# Patient Record
Sex: Female | Born: 1960 | Race: White | Hispanic: No | Marital: Married | Smoking: Never smoker
Health system: Southern US, Community
[De-identification: ages and names within clinical notes are randomized; demographics above are authoritative.]

## PROBLEM LIST (undated history)

## (undated) DIAGNOSIS — H8109 Meniere's disease, unspecified ear: Secondary | ICD-10-CM

## (undated) DIAGNOSIS — I639 Cerebral infarction, unspecified: Secondary | ICD-10-CM

## (undated) DIAGNOSIS — K5190 Ulcerative colitis, unspecified, without complications: Secondary | ICD-10-CM

## (undated) HISTORY — DX: Cerebral infarction, unspecified: I63.9

## (undated) HISTORY — DX: Ulcerative colitis, unspecified, without complications: K51.90

## (undated) HISTORY — DX: Meniere's disease, unspecified ear: H81.09

---

## 1997-12-16 ENCOUNTER — Other Ambulatory Visit: Admission: RE | Admit: 1997-12-16 | Discharge: 1997-12-16 | Payer: Self-pay | Admitting: Obstetrics & Gynecology

## 2001-06-22 ENCOUNTER — Ambulatory Visit (HOSPITAL_COMMUNITY): Admission: RE | Admit: 2001-06-22 | Discharge: 2001-06-22 | Payer: Self-pay | Admitting: Obstetrics and Gynecology

## 2001-06-22 ENCOUNTER — Encounter (INDEPENDENT_AMBULATORY_CARE_PROVIDER_SITE_OTHER): Payer: Self-pay

## 2002-11-19 ENCOUNTER — Other Ambulatory Visit: Admission: RE | Admit: 2002-11-19 | Discharge: 2002-11-19 | Payer: Self-pay | Admitting: Obstetrics and Gynecology

## 2004-02-20 ENCOUNTER — Other Ambulatory Visit: Admission: RE | Admit: 2004-02-20 | Discharge: 2004-02-20 | Payer: Self-pay | Admitting: Obstetrics and Gynecology

## 2005-05-19 ENCOUNTER — Other Ambulatory Visit: Admission: RE | Admit: 2005-05-19 | Discharge: 2005-05-19 | Payer: Self-pay | Admitting: Obstetrics and Gynecology

## 2005-05-26 ENCOUNTER — Encounter: Admission: RE | Admit: 2005-05-26 | Discharge: 2005-05-26 | Payer: Self-pay | Admitting: Obstetrics and Gynecology

## 2005-06-21 ENCOUNTER — Ambulatory Visit: Payer: Self-pay | Admitting: Internal Medicine

## 2006-05-22 ENCOUNTER — Encounter: Admission: RE | Admit: 2006-05-22 | Discharge: 2006-05-22 | Payer: Self-pay | Admitting: Obstetrics and Gynecology

## 2006-09-06 ENCOUNTER — Ambulatory Visit (HOSPITAL_COMMUNITY): Admission: RE | Admit: 2006-09-06 | Discharge: 2006-09-06 | Payer: Self-pay | Admitting: Gastroenterology

## 2006-09-06 ENCOUNTER — Encounter (INDEPENDENT_AMBULATORY_CARE_PROVIDER_SITE_OTHER): Payer: Self-pay | Admitting: Specialist

## 2006-10-23 ENCOUNTER — Emergency Department (HOSPITAL_COMMUNITY): Admission: EM | Admit: 2006-10-23 | Discharge: 2006-10-24 | Payer: Self-pay | Admitting: Emergency Medicine

## 2007-06-04 ENCOUNTER — Encounter: Admission: RE | Admit: 2007-06-04 | Discharge: 2007-06-04 | Payer: Self-pay | Admitting: Obstetrics and Gynecology

## 2008-06-06 HISTORY — PX: LUMBAR DISC SURGERY: SHX700

## 2008-06-25 ENCOUNTER — Encounter: Admission: RE | Admit: 2008-06-25 | Discharge: 2008-06-25 | Payer: Self-pay | Admitting: Obstetrics and Gynecology

## 2008-09-07 ENCOUNTER — Emergency Department (HOSPITAL_COMMUNITY): Admission: EM | Admit: 2008-09-07 | Discharge: 2008-09-07 | Payer: Self-pay | Admitting: Emergency Medicine

## 2008-09-15 ENCOUNTER — Ambulatory Visit (HOSPITAL_COMMUNITY): Admission: RE | Admit: 2008-09-15 | Discharge: 2008-09-16 | Payer: Self-pay | Admitting: Neurological Surgery

## 2009-07-02 ENCOUNTER — Encounter: Admission: RE | Admit: 2009-07-02 | Discharge: 2009-07-02 | Payer: Self-pay | Admitting: Obstetrics and Gynecology

## 2010-06-26 ENCOUNTER — Encounter: Payer: Self-pay | Admitting: Obstetrics and Gynecology

## 2010-07-05 ENCOUNTER — Encounter
Admission: RE | Admit: 2010-07-05 | Discharge: 2010-07-05 | Payer: Self-pay | Source: Home / Self Care | Attending: Obstetrics and Gynecology | Admitting: Obstetrics and Gynecology

## 2010-07-07 ENCOUNTER — Other Ambulatory Visit: Payer: Self-pay | Admitting: Obstetrics and Gynecology

## 2010-07-07 DIAGNOSIS — R928 Other abnormal and inconclusive findings on diagnostic imaging of breast: Secondary | ICD-10-CM

## 2010-07-13 ENCOUNTER — Ambulatory Visit
Admission: RE | Admit: 2010-07-13 | Discharge: 2010-07-13 | Disposition: A | Discharge status: Home / Self Care | Payer: BC Managed Care – PPO | Source: Ambulatory Visit | Attending: Obstetrics and Gynecology | Admitting: Obstetrics and Gynecology

## 2010-07-13 DIAGNOSIS — R928 Other abnormal and inconclusive findings on diagnostic imaging of breast: Secondary | ICD-10-CM

## 2010-08-21 ENCOUNTER — Emergency Department (HOSPITAL_COMMUNITY)
Admission: EM | Admit: 2010-08-21 | Discharge: 2010-08-22 | Disposition: A | Discharge status: Home / Self Care | Payer: BC Managed Care – PPO | Attending: Emergency Medicine | Admitting: Emergency Medicine

## 2010-08-21 ENCOUNTER — Emergency Department (HOSPITAL_COMMUNITY): Payer: BC Managed Care – PPO

## 2010-08-21 DIAGNOSIS — R112 Nausea with vomiting, unspecified: Secondary | ICD-10-CM | POA: Insufficient documentation

## 2010-08-21 DIAGNOSIS — H81399 Other peripheral vertigo, unspecified ear: Secondary | ICD-10-CM | POA: Insufficient documentation

## 2010-08-21 DIAGNOSIS — G319 Degenerative disease of nervous system, unspecified: Secondary | ICD-10-CM | POA: Insufficient documentation

## 2010-08-21 DIAGNOSIS — R51 Headache: Secondary | ICD-10-CM | POA: Insufficient documentation

## 2010-08-25 ENCOUNTER — Other Ambulatory Visit: Payer: Self-pay | Admitting: Internal Medicine

## 2010-08-25 ENCOUNTER — Inpatient Hospital Stay: Admission: RE | Admit: 2010-08-25 | Payer: BC Managed Care – PPO | Source: Ambulatory Visit

## 2010-08-25 DIAGNOSIS — R42 Dizziness and giddiness: Secondary | ICD-10-CM

## 2010-08-25 DIAGNOSIS — R51 Headache: Secondary | ICD-10-CM

## 2010-08-25 DIAGNOSIS — R270 Ataxia, unspecified: Secondary | ICD-10-CM

## 2010-09-02 ENCOUNTER — Other Ambulatory Visit: Payer: Self-pay | Admitting: Otolaryngology

## 2010-09-02 DIAGNOSIS — R42 Dizziness and giddiness: Secondary | ICD-10-CM

## 2010-09-02 DIAGNOSIS — R270 Ataxia, unspecified: Secondary | ICD-10-CM

## 2010-09-02 DIAGNOSIS — R51 Headache: Secondary | ICD-10-CM

## 2010-09-03 ENCOUNTER — Ambulatory Visit
Admission: RE | Admit: 2010-09-03 | Discharge: 2010-09-03 | Disposition: A | Discharge status: Home / Self Care | Payer: BC Managed Care – PPO | Source: Ambulatory Visit | Attending: Otolaryngology | Admitting: Otolaryngology

## 2010-09-03 DIAGNOSIS — R270 Ataxia, unspecified: Secondary | ICD-10-CM

## 2010-09-03 DIAGNOSIS — R51 Headache: Secondary | ICD-10-CM

## 2010-09-03 DIAGNOSIS — R42 Dizziness and giddiness: Secondary | ICD-10-CM

## 2010-09-03 MED ORDER — GADOBENATE DIMEGLUMINE 529 MG/ML IV SOLN
13.0000 mL | Freq: Once | INTRAVENOUS | Status: AC | PRN
  Administered 2010-09-03: 13 mL via INTRAVENOUS

## 2010-09-15 LAB — CBC
Hemoglobin: 12.6 g/dL (ref 12.0–15.0)
RBC: 3.73 MIL/uL — ABNORMAL LOW (ref 3.87–5.11)
RDW: 11.7 % (ref 11.5–15.5)
WBC: 5.9 10*3/uL (ref 4.0–10.5)

## 2010-10-19 NOTE — Op Note (Signed)
NAME:  Kerri Gonzalez, Kerri Gonzalez                ACCOUNT NO.:  [id number]   MEDICAL RECORD NO.:  [id number]          PATIENT TYPE:  OIB   LOCATION:  3528                         FACILITY:  [facility]   PHYSICIAN:  Stefani Dama, M.D.  DATE OF BIRTH:  [birth year]   DATE OF PROCEDURE:  09/15/2008  DATE OF DISCHARGE:                               OPERATIVE REPORT   PREOPERATIVE DIAGNOSIS:  Herniated nucleus pulposus, L2-L3 right with  right lumbar radiculopathy.   POSTOPERATIVE DIAGNOSIS:  Herniated nucleus pulposus, L2-L3 right with  right lumbar radiculopathy.   PROCEDURE:  METRx diskectomy, L2-L3 right with operating microscope,  microdissection technique.   SURGEON:  Stefani Dama, MD   FIRST ASSISTANT:  Danae Orleans. Venetia Maxon, MD   ANESTHESIA:  General endotracheal.   INDICATIONS:  Kerri Gonzalez is a 50 year old individual, who had the  acute onset of severe and unrelenting pain about 2 weeks ago.  She was  away on occasion when she bent to lift something and experienced sudden  pain, radiating into the right buttock and right thigh.  She was found  to have a large extruded fragment of disk on the right side at the L2-L3  level causing compression cephalad in the foramen of the L2 nerve root  having failed efforts at conservative management including some high-  dose steroids and bedrest with significant weakness in the iliopsoas.  She has now been advised regarding surgical decompression.   PROCEDURE:  The patient was brought to the operating room supine on the  stretcher.  After smooth induction of general endotracheal anesthesia,  she was turned prone.  The back was prepped with alcohol and DuraPrep  and draped in sterile fashion.  Fluoroscopic guidance was used to  localize the L2-L3 space on the right side, and then the skin above this  area was infiltrated with 10 mL of 1% lidocaine with epinephrine.  A K-  wire was then passed to the laminar arch of L2 and then using a winding  technique,  a series of dilators were passed over the K-wire.  A small  incision was created over the K-wire after it was passed.  The  dilatation was continued to 20-mm mark and then a 6 cm x 18 mm wide  cannula was placed over the dilators and fixed to the operating table  with a clamp.  The dilators were removed, and the operating microscope  was brought into view.  The laminar arch of L2 was identified and with  fluoroscopic localization just above the area of the disk, a small  laminotomy was created within the laminar arch of L2.  The common dural  tube was identified.  The dissection was carried out slightly laterally,  and then a suction retractor was placed along the lateral aspect of the  dura, and this was retracted medially.  With some probing, the fragment  of disk presented itself.  This was immediately below the L2 nerve root,  which could be visualized superiorly through this small aperture.  The  fragment could be taken up with a pituitary rongeur and it was removed  as a singular large piece.  Significant bleeding followed this and after  packing, the area was probed once and twice.  No other fragments of disk  were identified.  The decompression being obtained in this fashion.  The  retractors were removed.  Hemostasis superficially was obtained without  difficulty using some small pledgets of Gelfoam, which  were irrigated away and then the cannula was removed, and the wound was  closed with 3-0 Vicryl in interrupted fashion the subcutaneous and  subcuticular tissues.  Dermabond was placed on the skin.  Blood loss  estimated less than 50 mL.      Stefani Dama, M.D.  Electronically Signed     HJE/MEDQ  D:  09/15/2008  T:  09/16/2008  Job:  [id number]

## 2010-10-22 NOTE — Op Note (Signed)
NAME:  Kerri Gonzalez, Kerri Gonzalez                ACCOUNT NO.:  [id number]   MEDICAL RECORD NO.:  [id number]          PATIENT TYPE:  AMB   LOCATION:  ENDO                         FACILITY:  [facility]   PHYSICIAN:  Anselmo Rod, M.D.  DATE OF BIRTH:  Jun 08, 1960   DATE OF PROCEDURE:  09/06/2006  DATE OF DISCHARGE:                               OPERATIVE REPORT   PROCEDURE PERFORMED:  Colonoscopy with multiple cold biopsies.   ENDOSCOPIST:  Anselmo Rod, M.D.   INSTRUMENT USED:  Pentax video colonoscope.   INDICATIONS FOR PROCEDURE:  A 50 year old white female with a family  history of colon cancer in a paternal grandfather and personal history  of rectal bleeding undergoing a colonoscopy for change in bowel habits  and blood in stool.  Rule out IBD, colonic polyps, masses, etc.   PREPROCEDURE PREPARATION:  Informed consent was procured from the  patient.  The patient fasted for 4 hours prior to the procedure and  prepped with a bottle of MiraLax and Gatorade the night prior to the  procedure. The risks and benefits of the procedure including a 10% miss  rate of cancer and polyps were discussed with the patient as well.   PREPROCEDURE PHYSICAL:  The patient had stable vital signs.  Neck  supple. Chest clear to auscultation. S1, S2, regular.  Abdomen soft with  normal bowel sounds.   DESCRIPTION OF PROCEDURE:  The patient was placed in the left lateral  decubitus position and sedated with 70 mcg of Fentanyl and 7.5 mg of  Versed given intravenously in slow incremental doses.  Once the patient  was adequately sedated and maintained on low-flow oxygen and continuous  cardiac monitoring, the Pentax video colonoscope was advanced from the  rectum to the cecum.  The appendiceal orifice and ileocecal valve were  visualized and photographed.  The terminal ileum appeared healthy and  without lesions. Inflammatory changes with exudates were noted from 0 to  10 cm, sigmoid diverticulosis was noted.   Small internal hemorrhoids  were appreciated on retroflexion of the rectum.  The patient tolerated  the procedure well without complication.  Multiple biopsies were done  from the rectum from 0 to 10 cm.   RECOMMENDATIONS:  1. Await pathology results.  2. Canasa suppositories are going to be started for the patient 1000      mg 1 p.o. q.h.s.  3. Outpatient follow-up in the next 2 weeks for further      recommendations.      Anselmo Rod, M.D.  Electronically Signed     JNM/MEDQ  D:  09/06/2006  T:  09/06/2006  Job:  [id number]   cc:   Marcelino Duster L. Vincente Poli, M.D.

## 2010-10-22 NOTE — Op Note (Signed)
[facility]  Patient:    Kerri Gonzalez, Kerri Gonzalez Visit Number: [id number] MRN: [id number]          Service Type: DSU Location: [facility] Attending Physician:  Trevor Iha Dictated by:   Trevor Iha, M.D. Proc. Date: 06/22/01 Admit Date:  06/22/2001 Discharge Date: 06/22/2001                             Operative Report  PREOPERATIVE DIAGNOSES:       1. Abnormal uterine bleeding.                               2. Endometrial mass on histogram.  POSTOPERATIVE DIAGNOSES:      1. Abnormal uterine bleeding.                               2. Endometrial mass on histogram.  OPERATION:                    Hysteroscopy and dilatation and curettage.  SURGEON:                      Trevor Iha, M.D.  ANESTHESIA:                   Monitored anesthetic care and paracervical block.  INDICATIONS:                  Ms. Goga is a 50 year old, G5, P2, A3, with abnormal bleeding not responsive to conservative medical management including oral contraceptive agents.  She underwent a histogram which shows a small anterior echogenic area which may represent a small fibroid or polyp.  She presents today for definitive surgical evaluation and treatment.  Risks and benefits were discussed at length.  Informed consent was obtained.  FINDINGS:                     Essentially normal-appearing endometrium and normal-appearing ostia.  After curettage, the anterior endometrium showed slight ridge which may represent underlying fibroids.  No definitive polyp or fibroid projected into the endometrium were noted.  DESCRIPTION OF PROCEDURE:     After adequate analgesia, the patient placed in the dorsal lithotomy position.  She was sterilely prepped and draped.  The bladder was sterilely drained.  Graves speculum was placed.  Tenaculum was placed on anterior lip of the cervix.  A paracervical block was placed with 1% Xylocaine with 1:100,000 epinephrine.  The uterus  was sounded to 8 cm.  Uterus was dilated up to a #25 Hegar dilator. The hysteroscope was inserted and the above findings were noted.  Sharp curettage was performed with the greatest cervix felt throughout the entire endometrial cavity.  A mild amount of endometrial cuttings were seen.  Re-examination after the curettage revealed normal-appearing endometrial lining.  A small fibroid which does not project into the endometrium but a small ridge, which may represent fibroid, was seen but otherwise no underlying polyps or other masses were noted.  The ostia appeared to be normal as did the cervix.  At this time, the hysteroscope was removed.  Sorbitol deficit was minimal. The snip of os was 10 cc.  The tenaculum was removed from the anterior lip of the cervix and noted to be hemostatic.  The patient was  stable and transferred to the recovery room.  The hysterogram was normal x 3.  DISCHARGE INSTRUCTIONS:       The patient to be discharged home with follow-up in the office in two to three weeks.  Sent her home with a routine instruction sheet for D&C.  She will receive 30 mg of Toradol IV for discharge. Dictated by:   Trevor Iha, M.D. Attending Physician:  Trevor Iha DD:  06/22/01 TD:  06/24/01 Job: [id number]

## 2010-10-22 NOTE — H&P (Signed)
[facility]  Patient:    Kerri Gonzalez, Kerri Gonzalez Visit Number: [id number] MRN: [id number]          Service Type: DSU Location: [facility] Attending Physician:  Trevor Iha Dictated by:   Trevor Iha, M.D. Admit Date:  06/22/2001                           History and Physical  HISTORY OF PRESENT ILLNESS:   Kerri Gonzalez is a 50 year old G5, P2, A3 with persistent menometrorrhagia for the last several years, now controlled with oral contraceptive agents.  She underwent a sonohysterogram recently, which showed a small anterior endometrial echogenic mass.  She presents today for hysteroscopy D&C for evaluation and treatment of this.  PAST MEDICAL HISTORY:         Essentially negative.  PAST SURGICAL HISTORY:        Dilation and curettage for miscarriage.  PAST OBSTETRICAL HISTORY:     She has had two vaginal deliveries.  MEDICATIONS:                  Currently, she is on Mircette.  ALLERGIES:                    She has no known drug allergies.  PHYSICAL EXAMINATION:  HEART:                        Regular rate and rhythm.  LUNGS:                        Clear to auscultation bilaterally.  ABDOMEN:                      Nondistended, nontender.  PELVIC:                       Uterus is retroverted, mobile, nontender. Sonohysterogram, as described above, a small echogenic area anterior endometrium consistent with a polyp or small fibroid.  IMPRESSION AND PLAN:          Abnormal uterine bleeding, endometrial mass on sonohysterogram.  Abnormal bleeding has not responded to conservative medical management.  Recommended hysteroscopy dilation and curettage for evaluation and/or treatment of this.  Risks and benefits were discussed at length which included but are not limited to risk of infection, bleeding; damage to uterus, tubes, ovaries, bowel, or bladder; possibility of not being able to find or alleviate the cause of the abnormal bleeding.  The  patient does give her informed consent. Dictated by:   Trevor Iha, M.D. Attending Physician:  Trevor Iha DD:  06/22/01 TD:  06/22/01 Job: [id number]

## 2010-10-25 ENCOUNTER — Ambulatory Visit: Payer: BC Managed Care – PPO | Attending: Otolaryngology | Admitting: Physical Therapy

## 2010-10-25 DIAGNOSIS — R269 Unspecified abnormalities of gait and mobility: Secondary | ICD-10-CM | POA: Insufficient documentation

## 2010-10-25 DIAGNOSIS — R42 Dizziness and giddiness: Secondary | ICD-10-CM | POA: Insufficient documentation

## 2010-10-25 DIAGNOSIS — IMO0001 Reserved for inherently not codable concepts without codable children: Secondary | ICD-10-CM | POA: Insufficient documentation

## 2010-11-05 ENCOUNTER — Ambulatory Visit: Payer: BC Managed Care – PPO | Attending: Otolaryngology | Admitting: Physical Therapy

## 2010-11-05 DIAGNOSIS — R42 Dizziness and giddiness: Secondary | ICD-10-CM | POA: Insufficient documentation

## 2010-11-05 DIAGNOSIS — R269 Unspecified abnormalities of gait and mobility: Secondary | ICD-10-CM | POA: Insufficient documentation

## 2010-11-05 DIAGNOSIS — IMO0001 Reserved for inherently not codable concepts without codable children: Secondary | ICD-10-CM | POA: Insufficient documentation

## 2010-11-08 ENCOUNTER — Encounter: Payer: BC Managed Care – PPO | Admitting: Physical Therapy

## 2011-06-07 HISTORY — PX: LUMBAR DISC SURGERY: SHX700

## 2011-06-13 ENCOUNTER — Other Ambulatory Visit: Payer: Self-pay | Admitting: Obstetrics and Gynecology

## 2011-06-13 DIAGNOSIS — Z1231 Encounter for screening mammogram for malignant neoplasm of breast: Secondary | ICD-10-CM

## 2011-07-19 ENCOUNTER — Ambulatory Visit
Admission: RE | Admit: 2011-07-19 | Discharge: 2011-07-19 | Disposition: A | Discharge status: Home / Self Care | Payer: BC Managed Care – PPO | Source: Ambulatory Visit | Attending: Obstetrics and Gynecology | Admitting: Obstetrics and Gynecology

## 2011-07-19 ENCOUNTER — Ambulatory Visit: Payer: BC Managed Care – PPO

## 2011-07-19 DIAGNOSIS — Z1231 Encounter for screening mammogram for malignant neoplasm of breast: Secondary | ICD-10-CM

## 2011-12-26 ENCOUNTER — Other Ambulatory Visit: Payer: Self-pay | Admitting: Obstetrics and Gynecology

## 2012-03-08 IMAGING — MG MM DIGITAL SCREENING BILAT
4 series · 4 of 4 positions shown · non-contrast
Comparison: none

DG SCREEN MAMMOGRAM BILATERAL
Bilateral CC and MLO view(s) were taken.

DIGITAL SCREENING MAMMOGRAM WITH CAD:
The breast tissue is heterogeneously dense.  No masses or malignant type calcifications are 
identified.  Compared with prior studies.
Images were processed with CAD.

[R CC]
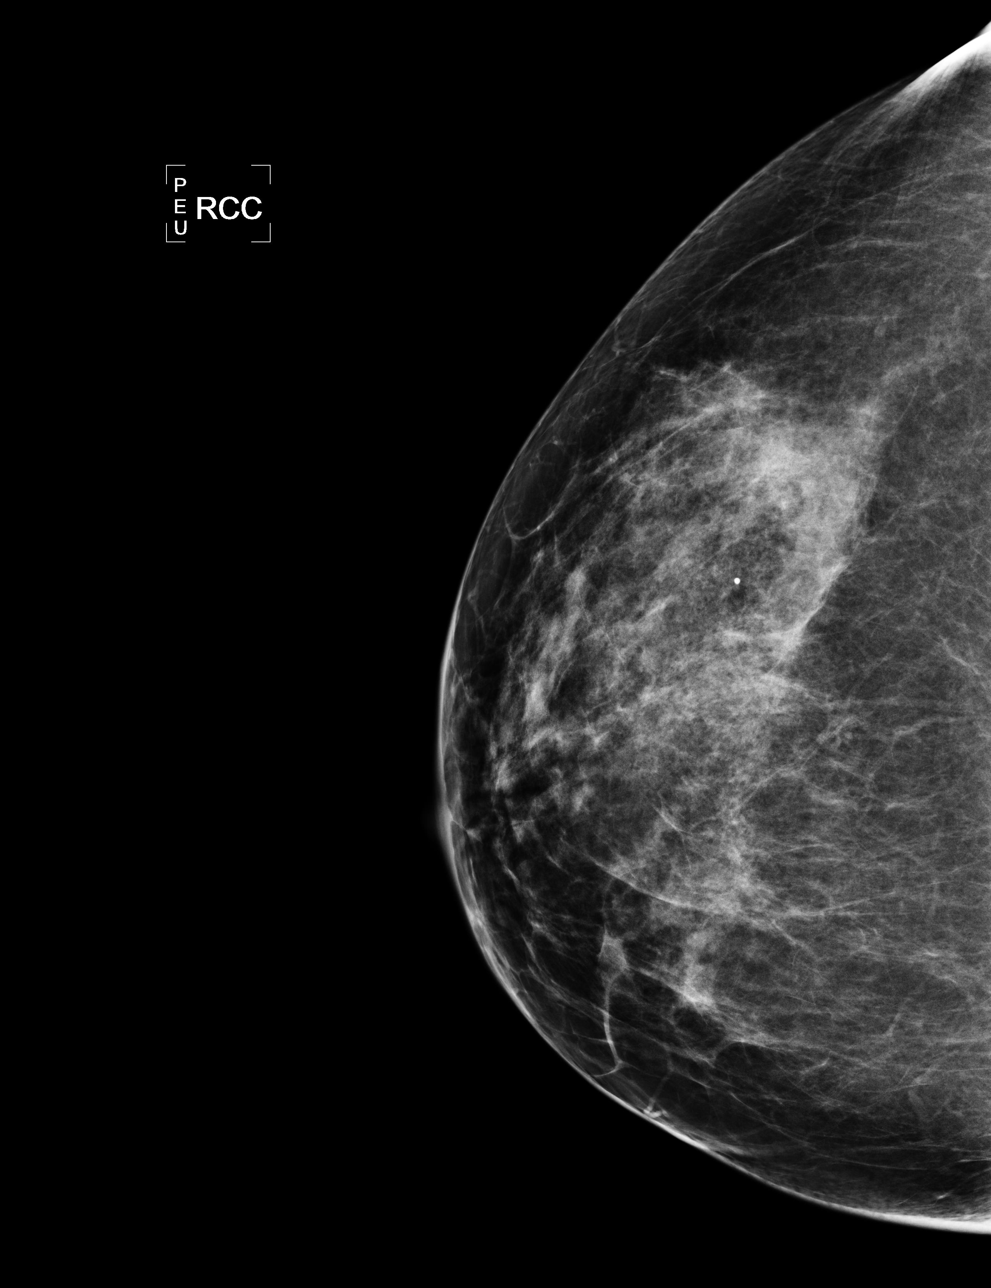

[L CC]
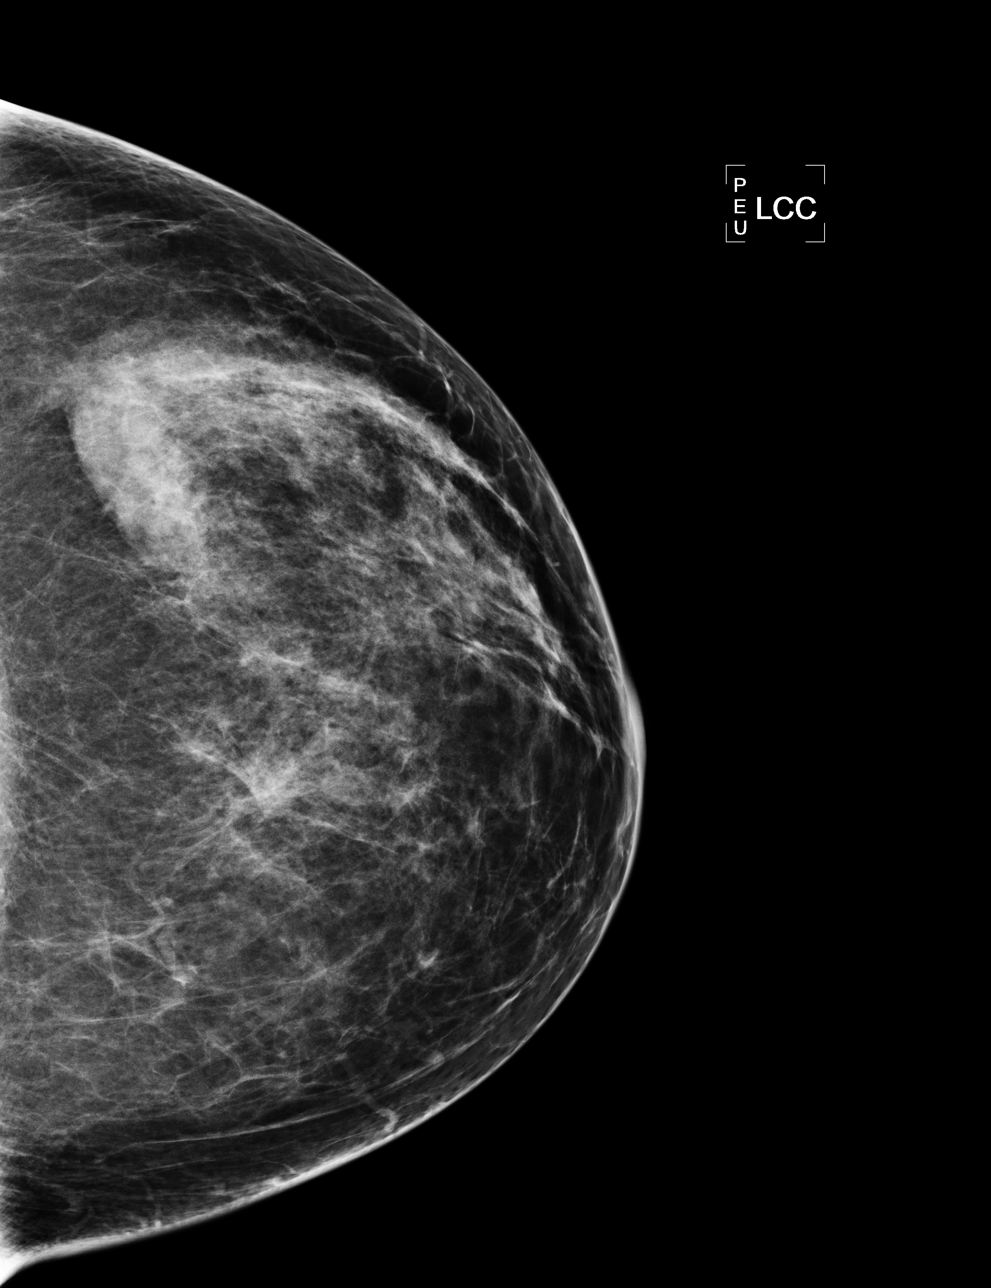

[L MLO]
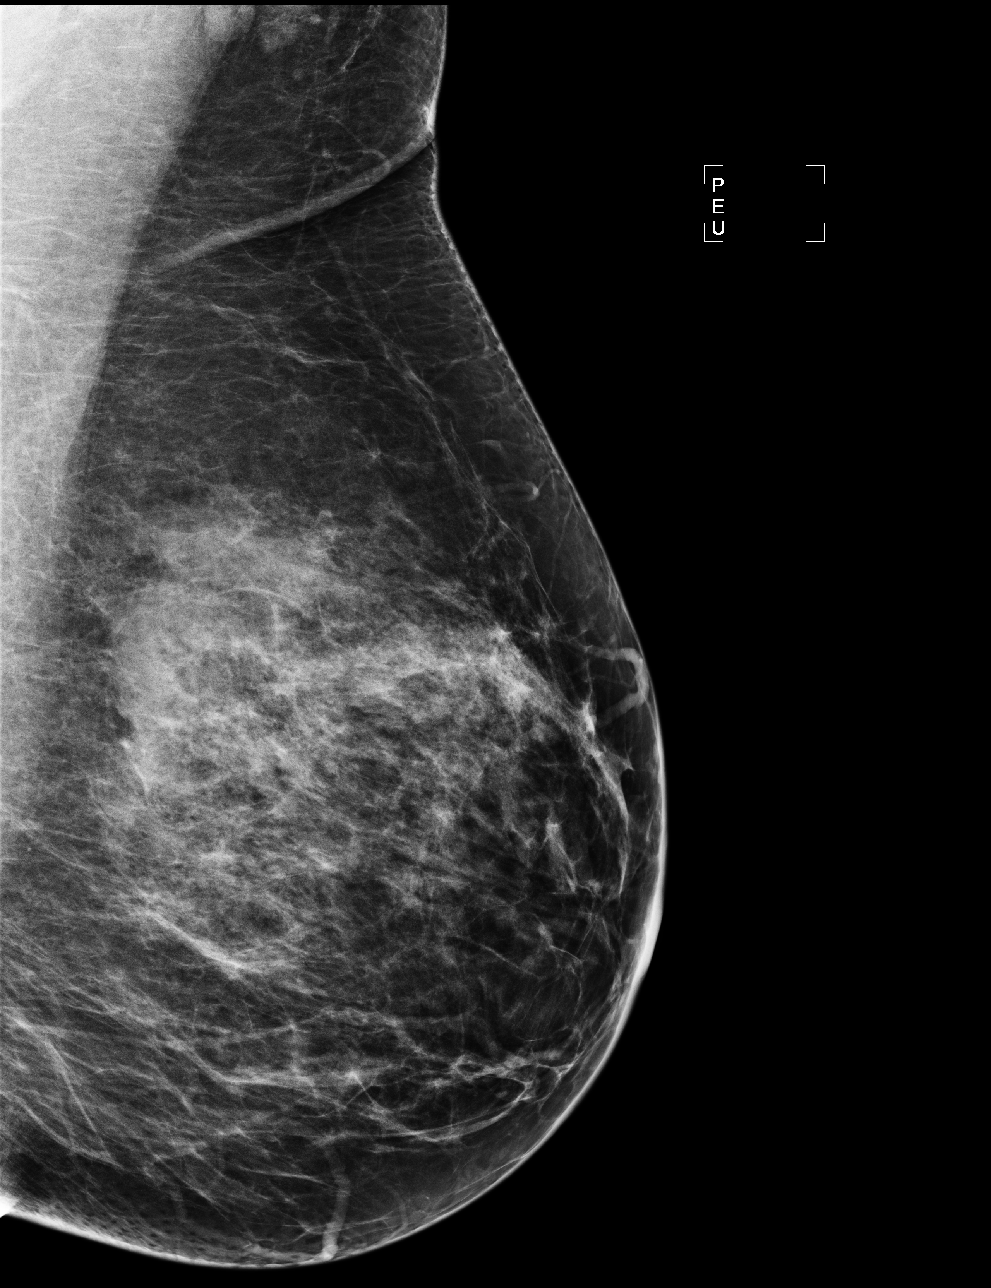

[R MLO]
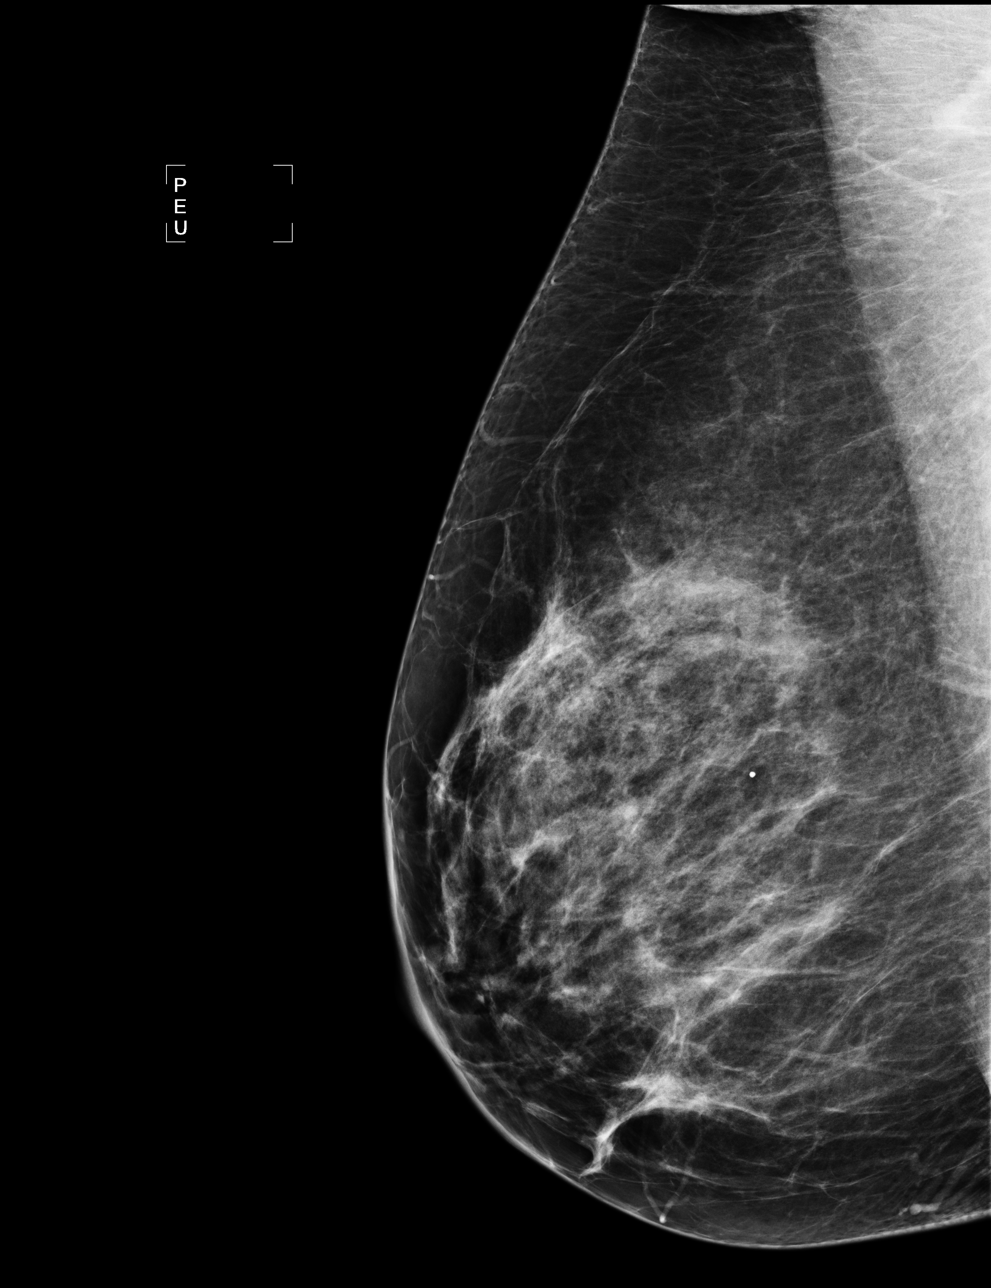

[4 of 4 positions shown; findings below may reference images not displayed]

IMPRESSION: No specific mammographic evidence of malignancy.  Next screening mammogram is recommended in one 
year.

A result letter of this screening mammogram will be mailed directly to the patient.

ASSESSMENT: Negative - BI-RADS 1

Screening mammogram in 1 year.
,

## 2012-06-22 ENCOUNTER — Other Ambulatory Visit: Payer: Self-pay | Admitting: Obstetrics and Gynecology

## 2012-06-22 DIAGNOSIS — Z1231 Encounter for screening mammogram for malignant neoplasm of breast: Secondary | ICD-10-CM

## 2012-07-31 ENCOUNTER — Ambulatory Visit
Admission: RE | Admit: 2012-07-31 | Discharge: 2012-07-31 | Disposition: A | Discharge status: Home / Self Care | Payer: BC Managed Care – PPO | Source: Ambulatory Visit | Attending: Obstetrics and Gynecology | Admitting: Obstetrics and Gynecology

## 2012-07-31 DIAGNOSIS — Z1231 Encounter for screening mammogram for malignant neoplasm of breast: Secondary | ICD-10-CM

## 2012-09-02 ENCOUNTER — Telehealth: Payer: Self-pay | Admitting: Gastroenterology

## 2012-09-02 NOTE — Telephone Encounter (Signed)
Patient is out of town", pain. She was seen at an urgent care where she was placed on antibiotics for presumed diverticulitis. It was suggested that she obtain a CT scan when she returns home. Pain is improving.  The patient was instructed to contact Dr. Loreta Ave tomorrow where she can decide whether or not to order CT scan. She was instructed to continue her antibiotics.

## 2012-09-03 ENCOUNTER — Ambulatory Visit
Admission: RE | Admit: 2012-09-03 | Discharge: 2012-09-03 | Disposition: A | Discharge status: Home / Self Care | Payer: BC Managed Care – PPO | Source: Ambulatory Visit | Attending: Gastroenterology | Admitting: Gastroenterology

## 2012-09-03 ENCOUNTER — Other Ambulatory Visit: Payer: Self-pay | Admitting: Gastroenterology

## 2012-09-03 DIAGNOSIS — R109 Unspecified abdominal pain: Secondary | ICD-10-CM

## 2012-09-03 MED ORDER — IOHEXOL 300 MG/ML  SOLN
100.0000 mL | Freq: Once | INTRAMUSCULAR | Status: AC | PRN
  Administered 2012-09-03: 100 mL via INTRAVENOUS

## 2013-01-23 ENCOUNTER — Other Ambulatory Visit: Payer: Self-pay | Admitting: Obstetrics and Gynecology

## 2013-07-09 ENCOUNTER — Other Ambulatory Visit: Payer: Self-pay

## 2013-07-09 DIAGNOSIS — Z1231 Encounter for screening mammogram for malignant neoplasm of breast: Secondary | ICD-10-CM

## 2013-08-02 ENCOUNTER — Ambulatory Visit
Admission: RE | Admit: 2013-08-02 | Discharge: 2013-08-02 | Disposition: A | Discharge status: Home / Self Care | Payer: Self-pay | Source: Ambulatory Visit

## 2013-08-02 DIAGNOSIS — Z1231 Encounter for screening mammogram for malignant neoplasm of breast: Secondary | ICD-10-CM

## 2014-02-20 ENCOUNTER — Other Ambulatory Visit: Payer: Self-pay | Admitting: Obstetrics and Gynecology

## 2014-02-21 LAB — CYTOLOGY - PAP

## 2014-07-17 ENCOUNTER — Other Ambulatory Visit: Payer: Self-pay

## 2014-07-17 DIAGNOSIS — Z1231 Encounter for screening mammogram for malignant neoplasm of breast: Secondary | ICD-10-CM

## 2014-08-19 ENCOUNTER — Ambulatory Visit
Admission: RE | Admit: 2014-08-19 | Discharge: 2014-08-19 | Disposition: A | Discharge status: Home / Self Care | Payer: 59 | Source: Ambulatory Visit

## 2014-08-19 ENCOUNTER — Other Ambulatory Visit: Payer: Self-pay

## 2014-08-19 DIAGNOSIS — Z1231 Encounter for screening mammogram for malignant neoplasm of breast: Secondary | ICD-10-CM

## 2015-02-24 ENCOUNTER — Other Ambulatory Visit: Payer: Self-pay | Admitting: Obstetrics and Gynecology

## 2015-02-25 LAB — CYTOLOGY - PAP

## 2015-04-27 ENCOUNTER — Other Ambulatory Visit: Payer: Self-pay | Admitting: Internal Medicine

## 2015-04-27 DIAGNOSIS — H3509 Other intraretinal microvascular abnormalities: Secondary | ICD-10-CM

## 2015-05-07 ENCOUNTER — Ambulatory Visit
Admission: RE | Admit: 2015-05-07 | Discharge: 2015-05-07 | Disposition: A | Discharge status: Home / Self Care | Payer: No Typology Code available for payment source | Source: Ambulatory Visit | Attending: Internal Medicine | Admitting: Internal Medicine

## 2015-05-07 DIAGNOSIS — H3509 Other intraretinal microvascular abnormalities: Secondary | ICD-10-CM

## 2015-07-22 ENCOUNTER — Other Ambulatory Visit: Payer: Self-pay

## 2015-07-22 DIAGNOSIS — Z1231 Encounter for screening mammogram for malignant neoplasm of breast: Secondary | ICD-10-CM

## 2015-08-24 ENCOUNTER — Ambulatory Visit
Admission: RE | Admit: 2015-08-24 | Discharge: 2015-08-24 | Disposition: A | Discharge status: Home / Self Care | Payer: Managed Care, Other (non HMO) | Source: Ambulatory Visit

## 2015-08-24 DIAGNOSIS — Z1231 Encounter for screening mammogram for malignant neoplasm of breast: Secondary | ICD-10-CM

## 2016-03-18 ENCOUNTER — Ambulatory Visit (INDEPENDENT_AMBULATORY_CARE_PROVIDER_SITE_OTHER): Payer: Managed Care, Other (non HMO) | Admitting: Neurology

## 2016-03-18 ENCOUNTER — Encounter: Payer: Self-pay | Admitting: Neurology

## 2016-03-18 VITALS — BP 125/89 | HR 70 | Ht 62.0 in | Wt 146.0 lb

## 2016-03-18 DIAGNOSIS — R413 Other amnesia: Secondary | ICD-10-CM | POA: Diagnosis not present

## 2016-03-18 NOTE — Progress Notes (Signed)
Reason for visit: Memory disturbance  Referring physician: Dr. Ofilia Gonzalez Kerri Gonzalez is a 55 y.o. female  History of present illness:  Kerri Gonzalez is a 55 year old right-handed white female with a history of very mild memory changes. The patient is concerned because her father has Alzheimer's disease that he developed in his 5s, he currently is around 44 with significant memory problems. The patient indicates that he has small vessel disease. The patient has noted over the last year that she has difficulty remembering a book that she read a month ago, she may misplace things about the house on occasion, but not frequently. Otherwise, she is keeping up with her medications, appointments, and she has no problems managing finances. She denies any issues with remembering names for people or coming up with the right words for things. The patient denies any issues with directions while driving. She has not altered any of her activities of daily living because of memory problems. She has had a chronic problem with sleeping, but she denies any significant fatigue issues or energy level once during the daytime. She is going through menopause, she has had a lot of symptoms regarding this. She denies any numbness or weakness of the face, arms, or legs. She denies any balance problems or difficulty controlling the bowels or the bladder. She comes into the office today for an evaluation.  Past Medical History:  Diagnosis Date  . Ischemic stroke (HCC)   . Meniere disease   . Ulcerative colitis [facility])     Past Surgical History:  Procedure Laterality Date  . LUMBAR DISC SURGERY  2010    Family History  Problem Relation Age of Onset  . Alzheimer's disease Father   . Diabetes Maternal Grandmother   . Dementia Paternal Grandmother   . Prostate cancer Paternal Grandfather   . Heart attack Paternal Grandfather     Social history:  reports that she has never smoked. She has never used smokeless  tobacco. She reports that she drinks alcohol. She reports that she does not use drugs.  Medications:  Prior to Admission medications   Medication Sig Start Date End Date Taking? Authorizing Provider  Cholecalciferol (VITAMIN D3) 5000 units CAPS Take 1 capsule by mouth daily.   Yes Historical Provider, MD  co-enzyme Q-10 30 MG capsule Take 30 mg by mouth daily.   Yes Historical Provider, MD  Cyanocobalamin (B12 LIQUID HEALTH BOOSTER PO) Take 1 mL by mouth daily.   Yes Historical Provider, MD  Melatonin 3 MG TABS Take 1 tablet by mouth at bedtime.   Yes Historical Provider, MD  MINIVELLE 0.075 MG/24HR  02/29/16  Yes Historical Provider, MD  Multiple Vitamin (MULTIVITAMIN) capsule Take 1 capsule by mouth daily.   Yes Historical Provider, MD  Multiple Vitamins-Minerals (EYE VITAMINS PO) Take by mouth daily.   Yes Historical Provider, MD  Omega-3 Fatty Acids (FISH OIL) 1200 MG CAPS Take 2 capsules by mouth daily.   Yes Historical Provider, MD  Probiotic Product (PROBIOTIC PO) Take 1 capsule by mouth daily.   Yes Historical Provider, MD  progesterone (PROMETRIUM) 100 MG capsule  03/16/16  Yes Historical Provider, MD     No Known Allergies  ROS:  Out of a complete 14 system review of symptoms, the patient complains only of the following symptoms, and all other reviewed systems are negative.  Snoring Feeling hot Allergies Insomnia   Blood pressure 125/89, pulse 70, height 5\' 2"  (1.575 m), weight 146 lb (66.2 kg).  Physical Exam  General: The patient is alert and cooperative at the time of the examination.  Eyes: Pupils are equal, round, and reactive to light. Discs are flat bilaterally.  Neck: The neck is supple, no carotid bruits are noted.  Respiratory: The respiratory examination is clear.  Cardiovascular: The cardiovascular examination reveals a regular rate and rhythm, no obvious murmurs or rubs are noted.  Skin: Extremities are without significant edema.  Neurologic  Exam  Mental status: The patient is alert and oriented x 3 at the time of the examination. The patient has apparent normal recent and remote memory, with an apparently normal attention span and concentration ability. Mini-Mental Status Examination done today shows a total score of 30/30.  Cranial nerves: Facial symmetry is present. There is good sensation of the face to pinprick and soft touch bilaterally. The strength of the facial muscles and the muscles to head turning and shoulder shrug are normal bilaterally. Speech is well enunciated, no aphasia or dysarthria is noted. Extraocular movements are full. Visual fields are full. The tongue is midline, and the patient has symmetric elevation of the soft palate. No obvious hearing deficits are noted.  Motor: The motor testing reveals 5 over 5 strength of all 4 extremities. Good symmetric motor tone is noted throughout.  Sensory: Sensory testing is intact to pinprick, soft touch, vibration sensation, and position sense on all 4 extremities. No evidence of extinction is noted.  Coordination: Cerebellar testing reveals good finger-nose-finger and heel-to-shin bilaterally.  Gait and station: Gait is normal. Tandem gait is normal. Romberg is negative. No drift is seen.  Reflexes: Deep tendon reflexes are symmetric and normal bilaterally. Toes are downgoing bilaterally.   Assessment/Plan:   1. Reported mild memory problems  The patient is having very minimal changes in memory. 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 We have discussed pursuing further workup such as MRI, blood work, and neurocognitive evaluation. The patient will discuss this workup with her husband, she will contact me if she wishes to continue with the workup.  Kerri Gonzalez. Kerri Asmi Fugere MD 03/18/2016 9:01 AM  [facility] [address] [place], [place]  [id number]  Phone [phone] Fax [phone]

## 2016-07-25 ENCOUNTER — Ambulatory Visit (INDEPENDENT_AMBULATORY_CARE_PROVIDER_SITE_OTHER): Payer: Managed Care, Other (non HMO) | Admitting: Podiatry

## 2016-07-25 ENCOUNTER — Encounter: Payer: Self-pay | Admitting: Podiatry

## 2016-07-25 ENCOUNTER — Ambulatory Visit (INDEPENDENT_AMBULATORY_CARE_PROVIDER_SITE_OTHER): Payer: Managed Care, Other (non HMO)

## 2016-07-25 VITALS — BP 136/90 | HR 64 | Resp 14

## 2016-07-25 DIAGNOSIS — S92515A Nondisplaced fracture of proximal phalanx of left lesser toe(s), initial encounter for closed fracture: Secondary | ICD-10-CM | POA: Diagnosis not present

## 2016-07-25 DIAGNOSIS — M2042 Other hammer toe(s) (acquired), left foot: Secondary | ICD-10-CM

## 2016-07-25 NOTE — Progress Notes (Addendum)
   Subjective:    Patient ID: Kerri Gonzalez, female    DOB: 07-11-1960, 56 y.o.   MRN: [id number]  HPI  56 year old female presents the also concerns of left fifth toe pain that she hit on a callus about 3 days ago. She states that the toe was sitting pointed outwards and she tape the toe to the fourth toe to help get it back in position. She's had no other treatment since then. She states that the pain started to subside was difficult to get away to her foot the toe. She is complaints today.   Review of Systems  All other systems reviewed and are negative.      Objective:   Physical Exam General: AAO x3, NAD  Dermatological: Mild edema is present the right fifth toe. There is no open sores identified.  Vascular: DP/PT pulses 2/4, CRT < 3 seconds.  There is no pain with calf compression, swelling, warmth, erythema.   Neruologic: Grossly intact via light touch bilateral. Vibratory intact via tuning fork bilateral. Protective threshold with Semmes Wienstein monofilament intact to all pedal sites bilateral.   Musculoskeletal: There is tenderness to left fifth toe. There is no pain in the metatarsals. There is minimal edema to the left fifth toe there is no erythema. There is no other areas of tenderness identified this time.  Gait: Unassisted, Nonantalgic.     Assessment & Plan:   56 year old female left fifth toe fracture  -Treatment options discussed including all alternatives, risks, and complications. -Etiology of symptoms were discussed -X-rays were obtained and reviewed with the patient. Actually the fifth digit is present.  -Recommended surgical shoe. However she did not want this today and felt better in a regular shoe. Discussed that he buddy tape the toe to help  -RTC in 4 weeks or sooner if needed  *x-rays next appointment *Patient did get surgical shoe  Ovid CurdMatthew Wagoner, DPM

## 2016-08-01 DIAGNOSIS — R52 Pain, unspecified: Secondary | ICD-10-CM

## 2016-08-22 ENCOUNTER — Ambulatory Visit: Payer: Managed Care, Other (non HMO) | Admitting: Podiatry

## 2016-09-26 ENCOUNTER — Other Ambulatory Visit: Payer: Self-pay | Admitting: Obstetrics and Gynecology

## 2016-09-26 DIAGNOSIS — Z1231 Encounter for screening mammogram for malignant neoplasm of breast: Secondary | ICD-10-CM

## 2016-10-12 ENCOUNTER — Ambulatory Visit
Admission: RE | Admit: 2016-10-12 | Discharge: 2016-10-12 | Disposition: A | Discharge status: Home / Self Care | Payer: Managed Care, Other (non HMO) | Source: Ambulatory Visit | Attending: Obstetrics and Gynecology | Admitting: Obstetrics and Gynecology

## 2016-10-12 DIAGNOSIS — Z1231 Encounter for screening mammogram for malignant neoplasm of breast: Secondary | ICD-10-CM

## 2017-01-17 ENCOUNTER — Other Ambulatory Visit: Payer: Self-pay | Admitting: Internal Medicine

## 2017-01-17 DIAGNOSIS — K5190 Ulcerative colitis, unspecified, without complications: Secondary | ICD-10-CM

## 2017-01-17 DIAGNOSIS — R1013 Epigastric pain: Secondary | ICD-10-CM

## 2017-01-20 ENCOUNTER — Other Ambulatory Visit: Payer: Managed Care, Other (non HMO)

## 2017-01-24 ENCOUNTER — Other Ambulatory Visit: Payer: Managed Care, Other (non HMO)

## 2017-02-13 ENCOUNTER — Ambulatory Visit (HOSPITAL_COMMUNITY)
Admission: RE | Admit: 2017-02-13 | Discharge: 2017-02-13 | Disposition: A | Discharge status: Home / Self Care | Payer: Managed Care, Other (non HMO) | Source: Ambulatory Visit | Attending: Internal Medicine | Admitting: Internal Medicine

## 2017-02-13 ENCOUNTER — Other Ambulatory Visit (HOSPITAL_COMMUNITY): Payer: Self-pay | Admitting: Internal Medicine

## 2017-02-13 DIAGNOSIS — M5126 Other intervertebral disc displacement, lumbar region: Secondary | ICD-10-CM | POA: Diagnosis not present

## 2017-02-13 DIAGNOSIS — M5416 Radiculopathy, lumbar region: Secondary | ICD-10-CM

## 2017-02-13 DIAGNOSIS — M5136 Other intervertebral disc degeneration, lumbar region: Secondary | ICD-10-CM | POA: Diagnosis not present

## 2017-02-13 DIAGNOSIS — M48061 Spinal stenosis, lumbar region without neurogenic claudication: Secondary | ICD-10-CM | POA: Diagnosis not present

## 2017-02-14 ENCOUNTER — Ambulatory Visit (HOSPITAL_COMMUNITY): Payer: Managed Care, Other (non HMO)

## 2017-09-06 ENCOUNTER — Other Ambulatory Visit: Payer: Self-pay | Admitting: Obstetrics and Gynecology

## 2017-09-06 DIAGNOSIS — Z1231 Encounter for screening mammogram for malignant neoplasm of breast: Secondary | ICD-10-CM

## 2017-10-25 ENCOUNTER — Ambulatory Visit: Payer: Managed Care, Other (non HMO)

## 2017-11-09 ENCOUNTER — Ambulatory Visit
Admission: RE | Admit: 2017-11-09 | Discharge: 2017-11-09 | Disposition: A | Discharge status: Home / Self Care | Payer: Managed Care, Other (non HMO) | Source: Ambulatory Visit | Attending: Obstetrics and Gynecology | Admitting: Obstetrics and Gynecology

## 2017-11-09 DIAGNOSIS — Z1231 Encounter for screening mammogram for malignant neoplasm of breast: Secondary | ICD-10-CM

## 2017-11-10 ENCOUNTER — Ambulatory Visit: Payer: Managed Care, Other (non HMO)

## 2018-10-04 ENCOUNTER — Other Ambulatory Visit: Payer: Self-pay | Admitting: Obstetrics and Gynecology

## 2018-10-04 DIAGNOSIS — Z1231 Encounter for screening mammogram for malignant neoplasm of breast: Secondary | ICD-10-CM

## 2018-11-26 ENCOUNTER — Other Ambulatory Visit: Payer: Self-pay

## 2018-11-26 ENCOUNTER — Ambulatory Visit
Admission: RE | Admit: 2018-11-26 | Discharge: 2018-11-26 | Disposition: A | Discharge status: Home / Self Care | Payer: Managed Care, Other (non HMO) | Source: Ambulatory Visit | Attending: Obstetrics and Gynecology | Admitting: Obstetrics and Gynecology

## 2018-11-26 DIAGNOSIS — Z1231 Encounter for screening mammogram for malignant neoplasm of breast: Secondary | ICD-10-CM

## 2019-04-26 ENCOUNTER — Other Ambulatory Visit: Payer: Self-pay

## 2019-04-26 DIAGNOSIS — Z20822 Contact with and (suspected) exposure to covid-19: Secondary | ICD-10-CM

## 2019-04-28 LAB — NOVEL CORONAVIRUS, NAA: SARS-CoV-2, NAA: NOT DETECTED

## 2019-05-09 ENCOUNTER — Telehealth: Payer: Self-pay | Admitting: Unknown Physician Specialty

## 2019-05-09 ENCOUNTER — Other Ambulatory Visit: Payer: Self-pay | Admitting: Unknown Physician Specialty

## 2019-05-09 DIAGNOSIS — U071 COVID-19: Secondary | ICD-10-CM

## 2019-05-09 NOTE — Progress Notes (Signed)
Not currently on med list though patients states she is currently taking HCTZ for hypertension for many years.

## 2019-05-09 NOTE — Telephone Encounter (Signed)
Discussed with patient about Covid symptoms and the use of bamlanivimab, a monoclonal antibody infusion for those with mild to moderate Covid symptoms and at a high risk of hospitalization.  Pt is qualified for this infusion at the [facility] infusion center as over 55 with hypertension addressed and is actively being managed by a [facility] provider.    After discussing the infusion's costs, potential benefits and side effects, the patient has decided to accept treatment with monoclonal antibody infusion.

## 2019-05-10 ENCOUNTER — Other Ambulatory Visit: Payer: Self-pay

## 2019-05-10 ENCOUNTER — Ambulatory Visit (HOSPITAL_COMMUNITY)
Admission: RE | Admit: 2019-05-10 | Discharge: 2019-05-10 | Disposition: A | Discharge status: Home / Self Care | Payer: Managed Care, Other (non HMO) | Source: Ambulatory Visit | Attending: Critical Care Medicine | Admitting: Critical Care Medicine

## 2019-05-10 DIAGNOSIS — U071 COVID-19: Secondary | ICD-10-CM | POA: Diagnosis present

## 2019-05-10 DIAGNOSIS — Z20822 Contact with and (suspected) exposure to covid-19: Secondary | ICD-10-CM

## 2019-05-10 MED ORDER — METHYLPREDNISOLONE SODIUM SUCC 125 MG IJ SOLR: 125.0000 mg | Freq: Once | INTRAMUSCULAR | Status: DC | PRN

## 2019-05-10 MED ORDER — ALBUTEROL SULFATE HFA 108 (90 BASE) MCG/ACT IN AERS: 2.0000 | INHALATION_SPRAY | Freq: Once | RESPIRATORY_TRACT | Status: DC | PRN

## 2019-05-10 MED ORDER — DIPHENHYDRAMINE HCL 50 MG/ML IJ SOLN: 50.0000 mg | Freq: Once | INTRAMUSCULAR | Status: DC | PRN

## 2019-05-10 MED ORDER — SODIUM CHLORIDE 0.9 % IV SOLN
INTRAVENOUS | Status: DC | PRN
  Administered 2019-05-10: 1000 mL via INTRAVENOUS

## 2019-05-10 MED ORDER — SODIUM CHLORIDE 0.9 % IV SOLN
700.0000 mg | Freq: Once | INTRAVENOUS | Status: AC
  Administered 2019-05-10: 700 mg via INTRAVENOUS
  Filled 2019-05-10: qty 20

## 2019-05-10 MED ORDER — FAMOTIDINE IN NACL 20-0.9 MG/50ML-% IV SOLN: 20.0000 mg | Freq: Once | INTRAVENOUS | Status: DC | PRN

## 2019-05-10 MED ORDER — EPINEPHRINE 0.3 MG/0.3ML IJ SOAJ: 0.3000 mg | Freq: Once | INTRAMUSCULAR | Status: DC | PRN

## 2019-05-10 NOTE — Progress Notes (Signed)
  Diagnosis: COVID-19  Physician: Dr. Joya Gaskins  Procedure: bamlanivimab infusion Provided patient with bamlanivimab fact sheet for patients, parents and caregivers prior to infusion.  Complications: negative for complications during and post infusion. The patient tolerated without difficulty.   Discharge: Discharged home   Kerri Gonzalez, Kerri Gonzalez 05/10/2019

## 2019-05-12 LAB — NOVEL CORONAVIRUS, NAA: SARS-CoV-2, NAA: NOT DETECTED

## 2019-10-14 ENCOUNTER — Other Ambulatory Visit: Payer: Self-pay | Admitting: Obstetrics and Gynecology

## 2019-10-14 DIAGNOSIS — Z1231 Encounter for screening mammogram for malignant neoplasm of breast: Secondary | ICD-10-CM

## 2019-11-14 ENCOUNTER — Other Ambulatory Visit: Payer: Self-pay | Admitting: Internal Medicine

## 2019-11-14 DIAGNOSIS — E041 Nontoxic single thyroid nodule: Secondary | ICD-10-CM

## 2019-11-26 ENCOUNTER — Ambulatory Visit
Admission: RE | Admit: 2019-11-26 | Discharge: 2019-11-26 | Disposition: A | Discharge status: Home / Self Care | Payer: Managed Care, Other (non HMO) | Source: Ambulatory Visit | Attending: Internal Medicine | Admitting: Internal Medicine

## 2019-11-26 DIAGNOSIS — E041 Nontoxic single thyroid nodule: Secondary | ICD-10-CM

## 2019-11-27 ENCOUNTER — Ambulatory Visit
Admission: RE | Admit: 2019-11-27 | Discharge: 2019-11-27 | Disposition: A | Discharge status: Home / Self Care | Payer: Managed Care, Other (non HMO) | Source: Ambulatory Visit | Attending: Obstetrics and Gynecology | Admitting: Obstetrics and Gynecology

## 2019-11-27 ENCOUNTER — Other Ambulatory Visit: Payer: Self-pay

## 2019-11-27 DIAGNOSIS — Z1231 Encounter for screening mammogram for malignant neoplasm of breast: Secondary | ICD-10-CM

## 2020-07-08 ENCOUNTER — Ambulatory Visit: Payer: Managed Care, Other (non HMO) | Admitting: Family Medicine

## 2020-07-08 NOTE — Progress Notes (Deleted)
  Kerri Gonzalez - 60 y.o. female MRN [id number]  Date of birth: 1960/09/02  SUBJECTIVE:  Including CC & ROS.  No chief complaint on file.   Kerri Gonzalez is a 60 y.o. female that is  ***.  ***   Review of Systems See HPI   HISTORY: Past Medical, Surgical, Social, and Family History Reviewed & Updated per EMR.   Pertinent Historical Findings include:  Past Medical History:  Diagnosis Date  . Ischemic stroke (HCC)   . Meniere disease   . Ulcerative colitis [facility])     Past Surgical History:  Procedure Laterality Date  . LUMBAR DISC SURGERY  2010    Family History  Problem Relation Age of Onset  . Alzheimer's disease Father   . Diabetes Maternal Grandmother   . Dementia Paternal Grandmother   . Prostate cancer Paternal Grandfather   . Heart attack Paternal Grandfather   . Heart attack Brother     Social History   Socioeconomic History  . Marital status: Married    Spouse name: Not on file  . Number of children: 2  . Years of education: College  . Highest education level: Not on file  Occupational History  . Occupation: Self-employed  Tobacco Use  . Smoking status: Never Smoker  . Smokeless tobacco: Never Used  Substance and Sexual Activity  . Alcohol use: Yes    Comment: 4 drinks per week  . Drug use: No  . Sexual activity: Not on file    Comment: Married  Other Topics Concern  . Not on file  Social History Narrative   Lives at home w/ her husband   Right-handed   Caffeine: 2 cups per day   Social Determinants of Health   Financial Resource Strain: Not on file  Food Insecurity: Not on file  Transportation Needs: Not on file  Physical Activity: Not on file  Stress: Not on file  Social Connections: Not on file  Intimate Partner Violence: Not on file     PHYSICAL EXAM:  VS: There were no vitals taken for this visit. Physical Exam Gen: NAD, alert, cooperative with exam, well-appearing MSK:  ***      ASSESSMENT & PLAN:   No  problem-specific Assessment & Plan notes found for this encounter.

## 2020-10-13 ENCOUNTER — Other Ambulatory Visit: Payer: Self-pay | Admitting: Obstetrics and Gynecology

## 2020-10-13 DIAGNOSIS — Z1231 Encounter for screening mammogram for malignant neoplasm of breast: Secondary | ICD-10-CM

## 2020-12-08 ENCOUNTER — Other Ambulatory Visit: Payer: Self-pay

## 2020-12-08 ENCOUNTER — Ambulatory Visit
Admission: RE | Admit: 2020-12-08 | Discharge: 2020-12-08 | Disposition: A | Discharge status: Home / Self Care | Payer: Managed Care, Other (non HMO) | Source: Ambulatory Visit | Attending: Obstetrics and Gynecology | Admitting: Obstetrics and Gynecology

## 2020-12-08 DIAGNOSIS — Z1231 Encounter for screening mammogram for malignant neoplasm of breast: Secondary | ICD-10-CM

## 2020-12-11 ENCOUNTER — Other Ambulatory Visit: Payer: Self-pay | Admitting: Internal Medicine

## 2020-12-11 DIAGNOSIS — E041 Nontoxic single thyroid nodule: Secondary | ICD-10-CM

## 2020-12-25 ENCOUNTER — Ambulatory Visit
Admission: RE | Admit: 2020-12-25 | Discharge: 2020-12-25 | Disposition: A | Discharge status: Home / Self Care | Payer: Managed Care, Other (non HMO) | Source: Ambulatory Visit | Attending: Internal Medicine | Admitting: Internal Medicine

## 2020-12-25 DIAGNOSIS — E041 Nontoxic single thyroid nodule: Secondary | ICD-10-CM

## 2021-06-03 ENCOUNTER — Other Ambulatory Visit: Payer: Self-pay | Admitting: Internal Medicine

## 2021-06-03 DIAGNOSIS — Z8249 Family history of ischemic heart disease and other diseases of the circulatory system: Secondary | ICD-10-CM

## 2021-06-30 ENCOUNTER — Other Ambulatory Visit: Payer: Managed Care, Other (non HMO)

## 2021-07-14 ENCOUNTER — Other Ambulatory Visit: Payer: Managed Care, Other (non HMO)

## 2021-07-26 ENCOUNTER — Other Ambulatory Visit: Payer: Self-pay | Admitting: Gastroenterology

## 2021-07-26 DIAGNOSIS — R109 Unspecified abdominal pain: Secondary | ICD-10-CM

## 2021-07-27 ENCOUNTER — Ambulatory Visit
Admission: RE | Admit: 2021-07-27 | Discharge: 2021-07-27 | Disposition: A | Discharge status: Home / Self Care | Payer: Managed Care, Other (non HMO) | Source: Ambulatory Visit | Attending: Gastroenterology | Admitting: Gastroenterology

## 2021-07-27 DIAGNOSIS — R109 Unspecified abdominal pain: Secondary | ICD-10-CM

## 2021-07-27 MED ORDER — IOPAMIDOL (ISOVUE-300) INJECTION 61%
100.0000 mL | Freq: Once | INTRAVENOUS | Status: AC | PRN
  Administered 2021-07-27: 100 mL via INTRAVENOUS

## 2021-08-13 ENCOUNTER — Other Ambulatory Visit: Payer: Managed Care, Other (non HMO)

## 2021-09-08 ENCOUNTER — Ambulatory Visit
Admission: RE | Admit: 2021-09-08 | Discharge: 2021-09-08 | Disposition: A | Discharge status: Home / Self Care | Payer: No Typology Code available for payment source | Source: Ambulatory Visit | Attending: Internal Medicine | Admitting: Internal Medicine

## 2021-09-08 DIAGNOSIS — Z8249 Family history of ischemic heart disease and other diseases of the circulatory system: Secondary | ICD-10-CM

## 2021-10-25 ENCOUNTER — Other Ambulatory Visit: Payer: Self-pay | Admitting: Obstetrics and Gynecology

## 2021-10-25 DIAGNOSIS — Z1231 Encounter for screening mammogram for malignant neoplasm of breast: Secondary | ICD-10-CM

## 2021-12-21 ENCOUNTER — Ambulatory Visit
Admission: RE | Admit: 2021-12-21 | Discharge: 2021-12-21 | Disposition: A | Discharge status: Home / Self Care | Payer: Managed Care, Other (non HMO) | Source: Ambulatory Visit | Attending: Obstetrics and Gynecology | Admitting: Obstetrics and Gynecology

## 2021-12-21 DIAGNOSIS — Z1231 Encounter for screening mammogram for malignant neoplasm of breast: Secondary | ICD-10-CM

## 2022-09-19 ENCOUNTER — Encounter: Payer: Self-pay | Admitting: *Deleted

## 2022-10-07 ENCOUNTER — Other Ambulatory Visit: Payer: Self-pay | Admitting: Internal Medicine

## 2022-10-07 DIAGNOSIS — I7121 Aneurysm of the ascending aorta, without rupture: Secondary | ICD-10-CM

## 2022-10-07 DIAGNOSIS — E041 Nontoxic single thyroid nodule: Secondary | ICD-10-CM

## 2022-10-25 ENCOUNTER — Other Ambulatory Visit (HOSPITAL_COMMUNITY): Payer: Self-pay

## 2022-10-25 MED ORDER — ZEPBOUND 5 MG/0.5ML ~~LOC~~ SOAJ: 5.0000 mg | SUBCUTANEOUS | 0 refills | Status: AC

## 2022-10-25 MED ORDER — ZEPBOUND 2.5 MG/0.5ML ~~LOC~~ SOAJ
2.5000 mg | SUBCUTANEOUS | 0 refills | Status: AC
  Filled 2022-10-25 – 2022-10-26 (×2): qty 2, 28d supply, fill #0

## 2022-10-25 MED ORDER — ZEPBOUND 7.5 MG/0.5ML ~~LOC~~ SOAJ
7.5000 mg | SUBCUTANEOUS | 0 refills | Status: DC
  Filled 2022-11-18: qty 2, 28d supply, fill #0

## 2022-10-26 ENCOUNTER — Other Ambulatory Visit (HOSPITAL_COMMUNITY): Payer: Self-pay

## 2022-11-02 ENCOUNTER — Ambulatory Visit
Admission: RE | Admit: 2022-11-02 | Discharge: 2022-11-02 | Disposition: A | Discharge status: Home / Self Care | Payer: Managed Care, Other (non HMO) | Source: Ambulatory Visit | Attending: Internal Medicine | Admitting: Internal Medicine

## 2022-11-02 DIAGNOSIS — E041 Nontoxic single thyroid nodule: Secondary | ICD-10-CM

## 2022-11-07 ENCOUNTER — Other Ambulatory Visit: Payer: Self-pay | Admitting: Obstetrics and Gynecology

## 2022-11-07 DIAGNOSIS — Z1231 Encounter for screening mammogram for malignant neoplasm of breast: Secondary | ICD-10-CM

## 2022-11-14 ENCOUNTER — Ambulatory Visit
Admission: RE | Admit: 2022-11-14 | Discharge: 2022-11-14 | Disposition: A | Discharge status: Home / Self Care | Payer: Managed Care, Other (non HMO) | Source: Ambulatory Visit | Attending: Internal Medicine | Admitting: Internal Medicine

## 2022-11-14 DIAGNOSIS — I7121 Aneurysm of the ascending aorta, without rupture: Secondary | ICD-10-CM

## 2022-11-14 MED ORDER — IOPAMIDOL (ISOVUE-370) INJECTION 76%
75.0000 mL | Freq: Once | INTRAVENOUS | Status: AC | PRN
  Administered 2022-11-14: 75 mL via INTRAVENOUS

## 2022-11-18 ENCOUNTER — Other Ambulatory Visit (HOSPITAL_COMMUNITY): Payer: Self-pay

## 2022-11-18 MED ORDER — ZEPBOUND 5 MG/0.5ML ~~LOC~~ SOAJ: 5.0000 mg | SUBCUTANEOUS | 0 refills | Status: DC

## 2022-11-22 ENCOUNTER — Other Ambulatory Visit (HOSPITAL_COMMUNITY): Payer: Self-pay

## 2022-11-22 MED ORDER — PROGESTERONE MICRONIZED 100 MG PO CAPS
100.0000 mg | ORAL_CAPSULE | Freq: Every day | ORAL | 0 refills | Status: DC
  Filled 2022-11-22 (×3): qty 30, 30d supply, fill #0

## 2022-11-23 ENCOUNTER — Other Ambulatory Visit (HOSPITAL_COMMUNITY): Payer: Self-pay

## 2022-11-23 MED ORDER — HYDROCHLOROTHIAZIDE 12.5 MG PO TABS
12.5000 mg | ORAL_TABLET | Freq: Every day | ORAL | 6 refills | Status: AC
  Filled 2022-11-23: qty 90, 90d supply, fill #0

## 2022-11-23 MED ORDER — PROGESTERONE MICRONIZED 100 MG PO CAPS
100.0000 mg | ORAL_CAPSULE | Freq: Every day | ORAL | 1 refills | Status: AC
  Filled 2022-12-27: qty 30, 30d supply, fill #0
  Filled 2023-02-01: qty 30, 30d supply, fill #1

## 2022-11-23 MED ORDER — EVAMIST 1.53 MG/SPRAY TD SOLN
1.0000 | Freq: Every day | TRANSDERMAL | 1 refills | Status: AC
  Filled 2022-11-23 – 2023-01-30 (×3): qty 8.1, 56d supply, fill #0
  Filled 2023-09-11: qty 8.1, 56d supply, fill #1

## 2022-11-30 ENCOUNTER — Other Ambulatory Visit (HOSPITAL_COMMUNITY): Payer: Self-pay

## 2022-12-01 ENCOUNTER — Other Ambulatory Visit (HOSPITAL_COMMUNITY): Payer: Self-pay

## 2022-12-01 ENCOUNTER — Encounter (HOSPITAL_COMMUNITY): Payer: Self-pay

## 2022-12-01 MED ORDER — EVAMIST 1.53 MG/SPRAY TD SOLN
TRANSDERMAL | 3 refills | Status: AC
  Filled 2022-12-01: qty 24.3, 30d supply, fill #0
  Filled 2022-12-01 – 2022-12-02 (×2): qty 8.1, 56d supply, fill #0
  Filled 2023-03-25: qty 8.1, 56d supply, fill #1

## 2022-12-01 MED ORDER — PROGESTERONE MICRONIZED 100 MG PO CAPS
100.0000 mg | ORAL_CAPSULE | Freq: Every day | ORAL | 3 refills | Status: AC
  Filled 2022-12-01 – 2023-02-24 (×3): qty 90, 90d supply, fill #0
  Filled 2023-05-08 – 2023-07-03 (×3): qty 90, 90d supply, fill #1
  Filled 2023-09-30: qty 90, 90d supply, fill #2
  Filled 2023-10-18: qty 90, 90d supply, fill #3

## 2022-12-01 MED ORDER — ZEPBOUND 7.5 MG/0.5ML ~~LOC~~ SOAJ
7.5000 mg | SUBCUTANEOUS | 5 refills | Status: AC
  Filled 2022-12-01 – 2022-12-12 (×2): qty 2, 28d supply, fill #0

## 2022-12-02 ENCOUNTER — Other Ambulatory Visit (HOSPITAL_COMMUNITY): Payer: Self-pay

## 2022-12-05 ENCOUNTER — Other Ambulatory Visit (HOSPITAL_COMMUNITY): Payer: Self-pay

## 2022-12-09 ENCOUNTER — Other Ambulatory Visit (HOSPITAL_COMMUNITY): Payer: Self-pay

## 2022-12-09 MED ORDER — ZEPBOUND 10 MG/0.5ML ~~LOC~~ SOAJ
10.0000 mg | SUBCUTANEOUS | 11 refills | Status: AC
  Filled 2022-12-09 – 2023-01-07 (×2): qty 2, 28d supply, fill #0

## 2022-12-12 ENCOUNTER — Other Ambulatory Visit (HOSPITAL_COMMUNITY): Payer: Self-pay

## 2022-12-12 MED ORDER — HYDROCODONE-ACETAMINOPHEN 10-325 MG PO TABS
1.0000 | ORAL_TABLET | Freq: Four times a day (QID) | ORAL | 0 refills | Status: AC | PRN
  Filled 2022-12-12: qty 3, 1d supply, fill #0
  Filled 2022-12-12: qty 17, 4d supply, fill #0

## 2022-12-12 MED ORDER — PREDNISONE 10 MG PO TABS
ORAL_TABLET | ORAL | 0 refills | Status: DC
  Filled 2022-12-12: qty 21, 6d supply, fill #0

## 2022-12-19 ENCOUNTER — Other Ambulatory Visit (HOSPITAL_COMMUNITY): Payer: Self-pay

## 2022-12-19 MED ORDER — HYDROCODONE-ACETAMINOPHEN 10-325 MG PO TABS
1.0000 | ORAL_TABLET | Freq: Four times a day (QID) | ORAL | 0 refills | Status: AC
  Filled 2022-12-19: qty 30, 8d supply, fill #0

## 2022-12-26 ENCOUNTER — Ambulatory Visit: Payer: Managed Care, Other (non HMO)

## 2022-12-27 ENCOUNTER — Other Ambulatory Visit (HOSPITAL_COMMUNITY): Payer: Self-pay

## 2022-12-27 MED ORDER — GABAPENTIN 300 MG PO CAPS
ORAL_CAPSULE | ORAL | 0 refills | Status: AC
  Filled 2022-12-27: qty 18, 9d supply, fill #0
  Filled 2022-12-27: qty 90, 30d supply, fill #0

## 2022-12-27 MED ORDER — PREDNISONE 10 MG PO TABS
ORAL_TABLET | ORAL | 0 refills | Status: AC
  Filled 2022-12-27: qty 21, 6d supply, fill #0

## 2022-12-28 ENCOUNTER — Other Ambulatory Visit (HOSPITAL_COMMUNITY): Payer: Self-pay

## 2023-01-02 ENCOUNTER — Ambulatory Visit
Admission: RE | Admit: 2023-01-02 | Discharge: 2023-01-02 | Disposition: A | Discharge status: Home / Self Care | Payer: Managed Care, Other (non HMO) | Source: Ambulatory Visit | Attending: Obstetrics and Gynecology | Admitting: Obstetrics and Gynecology

## 2023-01-02 ENCOUNTER — Encounter: Payer: Self-pay | Admitting: Radiology

## 2023-01-02 DIAGNOSIS — Z1231 Encounter for screening mammogram for malignant neoplasm of breast: Secondary | ICD-10-CM

## 2023-01-09 ENCOUNTER — Other Ambulatory Visit: Payer: Self-pay

## 2023-01-09 ENCOUNTER — Encounter (HOSPITAL_COMMUNITY): Payer: Self-pay

## 2023-01-09 ENCOUNTER — Other Ambulatory Visit (HOSPITAL_COMMUNITY): Payer: Self-pay

## 2023-01-23 ENCOUNTER — Other Ambulatory Visit (HOSPITAL_COMMUNITY): Payer: Self-pay

## 2023-01-23 MED ORDER — MUPIROCIN 2 % EX OINT
TOPICAL_OINTMENT | Freq: Every day | CUTANEOUS | 0 refills | Status: AC
  Filled 2023-01-23: qty 22, 10d supply, fill #0

## 2023-01-30 ENCOUNTER — Other Ambulatory Visit (HOSPITAL_COMMUNITY): Payer: Self-pay

## 2023-01-31 ENCOUNTER — Other Ambulatory Visit (HOSPITAL_COMMUNITY): Payer: Self-pay

## 2023-01-31 MED ORDER — ZEPBOUND 12.5 MG/0.5ML ~~LOC~~ SOAJ
12.5000 mg | SUBCUTANEOUS | 6 refills | Status: DC
  Filled 2023-01-31: qty 2, 28d supply, fill #0
  Filled 2023-03-07: qty 2, 28d supply, fill #1
  Filled 2023-04-05: qty 2, 28d supply, fill #2
  Filled 2023-05-06: qty 2, 28d supply, fill #3
  Filled 2023-06-05: qty 2, 28d supply, fill #4
  Filled 2023-07-03: qty 2, 28d supply, fill #5
  Filled 2023-08-07: qty 2, 28d supply, fill #6

## 2023-02-09 ENCOUNTER — Other Ambulatory Visit (HOSPITAL_COMMUNITY): Payer: Self-pay

## 2023-02-09 MED ORDER — FLUOROMETHOLONE 0.1 % OP SUSP
1.0000 [drp] | Freq: Two times a day (BID) | OPHTHALMIC | 0 refills | Status: AC
  Filled 2023-02-09: qty 5, 14d supply, fill #0

## 2023-02-24 ENCOUNTER — Other Ambulatory Visit (HOSPITAL_COMMUNITY): Payer: Self-pay

## 2023-02-28 ENCOUNTER — Other Ambulatory Visit (HOSPITAL_COMMUNITY): Payer: Self-pay

## 2023-03-02 ENCOUNTER — Other Ambulatory Visit (HOSPITAL_COMMUNITY): Payer: Self-pay

## 2023-03-02 MED ORDER — DICLOFENAC SODIUM 1 % EX GEL
2.0000 g | Freq: Four times a day (QID) | CUTANEOUS | 1 refills | Status: AC
  Filled 2023-03-02: qty 100, 14d supply, fill #0
  Filled 2023-03-07: qty 100, 14d supply, fill #1

## 2023-03-02 MED ORDER — PREDNISONE 5 MG (21) PO TBPK
ORAL_TABLET | ORAL | 0 refills | Status: AC
  Filled 2023-03-02: qty 21, 6d supply, fill #0

## 2023-03-08 ENCOUNTER — Other Ambulatory Visit (HOSPITAL_COMMUNITY): Payer: Self-pay

## 2023-03-13 ENCOUNTER — Other Ambulatory Visit: Payer: Self-pay

## 2023-03-13 ENCOUNTER — Encounter: Payer: Self-pay | Admitting: Physical Therapy

## 2023-03-13 ENCOUNTER — Ambulatory Visit: Payer: Managed Care, Other (non HMO) | Attending: Neurological Surgery | Admitting: Physical Therapy

## 2023-03-13 DIAGNOSIS — M25571 Pain in right ankle and joints of right foot: Secondary | ICD-10-CM | POA: Insufficient documentation

## 2023-03-13 DIAGNOSIS — M6281 Muscle weakness (generalized): Secondary | ICD-10-CM | POA: Insufficient documentation

## 2023-03-13 DIAGNOSIS — M5459 Other low back pain: Secondary | ICD-10-CM | POA: Insufficient documentation

## 2023-03-13 NOTE — Therapy (Signed)
OUTPATIENT PHYSICAL THERAPY THORACOLUMBAR EVALUATION   Patient Name: Kerri Gonzalez MRN: [id number] DOB:1961-03-09, 62 y.o., female Today's Date: 03/13/2023  END OF SESSION:  PT End of Session - 03/13/23 1335     Visit Number 1    Number of Visits 16    Date for PT Re-Evaluation 05/08/23    Authorization Type [employer]    PT Start Time 1333    PT Stop Time 1420    PT Time Calculation (min) 47 min    Activity Tolerance Patient tolerated treatment well    Behavior During Therapy [facility] for tasks assessed/performed             Past Medical History:  Diagnosis Date   Meniere disease    Ulcerative colitis (HCC)    Past Surgical History:  Procedure Laterality Date   LUMBAR DISC SURGERY  2010   LUMBAR DISC SURGERY  2013   Patient Active Problem List   Diagnosis Date Noted   Memory change 03/18/2016    PCP: Cleatis Polka., MD   REFERRING PROVIDER: Barnett Abu, MD   REFERRING DIAG: Spondylolisthesis, lumbar region [M43.16]   Rationale for Evaluation and Treatment: Rehabilitation  THERAPY DIAG:  Pain in right ankle and joints of right foot  Muscle weakness (generalized)  Other low back pain  ONSET DATE: 2010  SUBJECTIVE:                                                                                                                                                                                           SUBJECTIVE STATEMENT: Pt had a guided lumbar injection at L2-L3 with referred pain down the RLE in the from in back to the knee. She reports having pain that began in the R ankle / foot starting within the last 2- 3 months which she has started PT at another clinic. She has a hx of lumbar discectomies in 2010 and 2013 with minimal relief. She describes pain starts in the R hip and radiates down specifically with trunk, but has noted referred pain to the L hip that she describes as compensation  PERTINENT HISTORY:  Hx of multiple lumbar discectomies  PAIN:  Are  you having pain? Yes: NPRS scale: 6-7/10 Pain location: Low back /  Pain description: intenise, aggrivating Aggravating factors: lifting , walking, stairs Relieving factors: stretching (cat cow), bird dog, heat  Are you having pain? Yes: NPRS scale: 7-8/10 Pain location: R ankle, achilles Pain description: sore/ achilles Aggravating factors: walking/ standing steps.  Relieving factors: ice, rock tape  PRECAUTIONS: None  RED FLAGS: None   WEIGHT BEARING RESTRICTIONS: No  FALLS:  Has patient fallen in last 6 months? No  LIVING ENVIRONMENT: Lives with: lives with their family Lives in: House/apartment Stairs: Yes: Internal: 20 steps; on left going up Has following equipment at home: None  OCCUPATION: Research officer, trade union  PLOF: Independent  PATIENT GOALS: be pain free, be more active beyond pilates   OBJECTIVE:  Note: Objective measures were completed at Evaluation unless otherwise noted.  DIAGNOSTIC FINDINGS:  N/A  PATIENT SURVEYS:  FOTO 42% predicted 59%  SCREENING FOR RED FLAGS: Bowel or bladder incontinence: No Cauda equina syndrome: No   COGNITION: Overall cognitive status: Within functional limits for tasks assessed     SENSATION: Light touch: slight numbness in  R L1 compared bil   POSTURE: rounded shoulders and forward head  PALPATION: TTP along the L SIJ and R piriformis. Soreness noted along the R distal calf/ achilles into insertion.   LUMBAR ROM:   AROM eval  Flexion 118 P! End range   Extension 15P  Right lateral flexion 18  Left lateral flexion 18 P!  Right rotation   Left rotation    (Blank rows = not tested)  NOTE: pain referred to the L SIJ   LOWER EXTREMITY ROM:     Active  Right eval Left eval  Hip flexion    Hip extension    Hip abduction    Hip adduction    Hip internal rotation    Hip external rotation    Knee flexion    Knee extension    Ankle dorsiflexion    Ankle plantarflexion    Ankle inversion    Ankle  eversion     (Blank rows = not tested)  LOWER EXTREMITY MMT:    MMT Right eval Left eval  Hip flexion 4- 3+  Hip extension 4- 3+  Hip abduction 4- 3+  Hip adduction    Hip internal rotation    Hip external rotation    Knee flexion    Knee extension    Ankle dorsiflexion    Ankle plantarflexion    Ankle inversion    Ankle eversion     (Blank rows = not tested)  LUMBAR SPECIAL TESTS:  SI Compression/distraction test: Positive on the L and Gaenslen's test: Positive  FUNCTIONAL TESTS:  30 seconds chair stand test  GAIT: Distance walked: 110 ft to treatment room  Assistive device utilized: None Level of assistance: Complete Independence Comments: mild antalgic pattern with decreased toe off on the R and and step length on the L  TODAY'S TREATMENT:                                                                                                                              [facility] Adult PT Treatment:  DATE: 03/13/2023 Therapeutic Exercise: Hamstring stretch PNF contract/ relax 2 x 30 sec  SLR on the L 2 x 10  Seated calf stretch with strap 2 x 30 sec.   Provided initial HEP Manual Therapy: MTPR on the R piriformis Tack and stretch of the R piriformis Neuromuscular re-ed: Gait training utilizing heel strike/ toe off     PATIENT EDUCATION:  Education details: evaluation findings, POC, goals, HEP with proper form/ rationale.  Person educated: Patient Education method: Explanation, Verbal cues, and Handouts Education comprehension: verbalized understanding  HOME EXERCISE PROGRAM: Access Code: [email or web address] URL: [email or web address] Date: 03/13/2023 Prepared by: Lulu Riding  Exercises - Seated Hamstring Stretch  - 1 x daily - 7 x weekly - 2 sets - 2 reps - 30 hold - SLR  - 1 x daily - 7 x weekly - 3 sets - 13 reps - 1 hold - Supine Piriformis Stretch  - 1 x daily - 7 x weekly - 2 sets - 2 reps - 30 -60 hold -  Sidelying Hip Abduction  - 1 x daily - 7 x weekly - 3 sets - 15 reps - Seated Calf Stretch with Strap  - 1 x daily - 7 x weekly - 2 sets - 10 reps - 30 -60 hold  ASSESSMENT:  CLINICAL IMPRESSION: Patient is a 62 y.o. F who was seen today for physical therapy evaluation and treatment for dx of Spondylolisthesis, lumbar region. Hilja has functional trunk mobility with reported pain noted at end range flexion and extension with pain located at the L SIJ. She demonstrates gross LE weakness with MMT. Special testing suggestion SIJ involvement with L innominate anterior deficit. She noted reduction in low back pain and improvement in trunk mobility with hamstring stretching/ hip flexor activation and STW for the R piriformis. She would benefit from physical therapy to decrease low back/ SIJ pain, improve LE strength, maximize gait/ lifting biomechanics and function by addressing the deficits listed.   OBJECTIVE IMPAIRMENTS: Abnormal gait, decreased balance, decreased endurance, decreased ROM, decreased strength, improper body mechanics, postural dysfunction, and pain.   ACTIVITY LIMITATIONS: carrying, lifting, standing, squatting, stairs, and locomotion level  PARTICIPATION LIMITATIONS: shopping, community activity, occupation, yard work, and school  PERSONAL FACTORS: Age, Past/current experiences, Time since onset of injury/illness/exacerbation, and 1 comorbidity: hx of discectomies  are also affecting patient's functional outcome.   REHAB POTENTIAL: Good  CLINICAL DECISION MAKING: Evolving/moderate complexity  EVALUATION COMPLEXITY: Moderate   GOALS: Goals reviewed with patient? Yes  SHORT TERM GOALS: Target date: 04/10/2023  Pt to be IND with initial HEP to assist with therapeutic progression Baseline: Goal status: INITIAL  2.  Pt to verbalize/ demo efficient posture and lifting mechanics to prevent and reduce low back pain Baseline:  Goal status: INITIAL  3.  Pt to be able to sit ,  stand and walk for >/= 15 min with </= max of 6/10 pain  Baseline:  Goal status: INITIAL  4.  Improve FOTO score to >/48% to demo improving function Baseline:  Goal status: INITIAL  LONG TERM GOALS: Target date: 05/08/2023  Pt to be increase gross LE strength to >/=4 to promote functional strength to assist with lifting/ carrying activities as related to her job requirements Baseline:  Goal status: INITIAL  2.  Pt to be able to stand and walk for >/=1 hour and perfrom all lifting activities related to her job described as min difficult  with </= 2/10 max pain  Baseline:  Goal status: INITIAL  3.  Pt to report no referred RLE symptoms for >/= 1 week for improvement in function and QOL. Baseline:  Goal status: INITIAL  4.  Pt to improve her FOTO score to >/= 59 To demonstrate improvement in function Baseline:  Goal status: INITIAL  5.  Pt to be IND with all HEP and is able to maintain and progress her current LOF IND.  Baseline:  Goal status: INITIAL PLAN:  PT FREQUENCY: 1-2x/week  PT DURATION: 8 weeks  PLANNED INTERVENTIONS: Therapeutic exercises, Therapeutic activity, Neuromuscular re-education, Balance training, Gait training, Patient/Family education, Self Care, Joint mobilization, Joint manipulation, Stair training, Aquatic Therapy, Dry Needling, Spinal manipulation, Spinal mobilization, Cryotherapy, Moist heat, Taping, Traction, Ionotophoresis 4mg /ml Dexamethasone, Manual therapy, and Re-evaluation.  PLAN FOR NEXT SESSION: Review / update HEP PRN. Measure LLD, stretch hamstring on L/ strengthening hip flexors on the L, STW for piriformis on the R, gross hip strengthening, stair training. STW on the R calf, calf stretching. Gait training.    Murry Khiev PT, DPT, LAT, ATC  03/13/23  3:02 PM

## 2023-03-25 ENCOUNTER — Other Ambulatory Visit (HOSPITAL_COMMUNITY): Payer: Self-pay

## 2023-03-26 ENCOUNTER — Other Ambulatory Visit (HOSPITAL_COMMUNITY): Payer: Self-pay

## 2023-03-27 ENCOUNTER — Other Ambulatory Visit: Payer: Self-pay

## 2023-03-27 ENCOUNTER — Ambulatory Visit: Payer: Managed Care, Other (non HMO) | Admitting: Physical Therapy

## 2023-03-27 ENCOUNTER — Other Ambulatory Visit (HOSPITAL_COMMUNITY): Payer: Self-pay

## 2023-03-27 DIAGNOSIS — M6281 Muscle weakness (generalized): Secondary | ICD-10-CM

## 2023-03-27 DIAGNOSIS — M25571 Pain in right ankle and joints of right foot: Secondary | ICD-10-CM

## 2023-03-27 DIAGNOSIS — M5459 Other low back pain: Secondary | ICD-10-CM

## 2023-03-27 NOTE — Therapy (Signed)
OUTPATIENT PHYSICAL THERAPY TREATMENT   Patient Name: Kerri Gonzalez MRN: [id number] DOB:[birth year], 62 y.o., female Today's Date: 03/27/2023  END OF SESSION:  PT End of Session - 03/27/23 1107     Visit Number 2    Number of Visits 16    Date for PT Re-Evaluation 05/08/23    Authorization Type [employer]    Progress Note Due on Visit 10    PT Start Time 1104    PT Stop Time 1146    PT Time Calculation (min) 42 min    Activity Tolerance Patient tolerated treatment well    Behavior During Therapy [facility] for tasks assessed/performed              Past Medical History:  Diagnosis Date   Meniere disease    Ulcerative colitis (HCC)    Past Surgical History:  Procedure Laterality Date   LUMBAR DISC SURGERY  2010   LUMBAR DISC SURGERY  2013   Patient Active Problem List   Diagnosis Date Noted   Memory change 03/18/2016    PCP: Cleatis Polka., MD   REFERRING PROVIDER: Barnett Abu, MD   REFERRING DIAG: Spondylolisthesis, lumbar region [M43.16]   Rationale for Evaluation and Treatment: Rehabilitation  THERAPY DIAG:  Pain in right ankle and joints of right foot  Muscle weakness (generalized)  Other low back pain  ONSET DATE: 2010  SUBJECTIVE:                                                                                                                                                                                           SUBJECTIVE STATEMENT: "I did just get back from being out of time town I did a lot of walking and noticed I would get tired and sore which resulted needing to take more transportation."  PERTINENT HISTORY:  Hx of multiple lumbar discectomies  PAIN:  Are you having pain? Yes: NPRS scale: 5/10 Pain location: Low back /  Pain description: intenise, aggrivating Aggravating factors: lifting , walking, stairs Relieving factors: stretching (cat cow), bird dog, heat  Are you having pain? Yes: NPRS scale: sitting 5/10, walking 8/10 Pain  location: R ankle, achilles Pain description: sore/ achilles Aggravating factors: walking/ standing steps.  Relieving factors: ice, rock tape  PRECAUTIONS: None  RED FLAGS: None   WEIGHT BEARING RESTRICTIONS: No  FALLS:  Has patient fallen in last 6 months? No  LIVING ENVIRONMENT: Lives with: lives with their family Lives in: House/apartment Stairs: Yes: Internal: 20 steps; on left going up Has following equipment at home: None  OCCUPATION: Research officer, trade union  PLOF: Independent  PATIENT GOALS: be pain  free, be more active beyond pilates   OBJECTIVE:  Note: Objective measures were completed at Evaluation unless otherwise noted.  DIAGNOSTIC FINDINGS:  N/A  PATIENT SURVEYS:  FOTO 42% predicted 59%  SCREENING FOR RED FLAGS: Bowel or bladder incontinence: No Cauda equina syndrome: No   COGNITION: Overall cognitive status: Within functional limits for tasks assessed     SENSATION: Light touch: slight numbness in  R L1 compared bil   POSTURE: rounded shoulders and forward head  PALPATION: TTP along the L SIJ and R piriformis. Soreness noted along the R distal calf/ achilles into insertion.    L length assessment:  03/27/2023 True: R:81.2 cm L: 82.1 cm  Apparent: R: 89.9 L: 90.5  LUMBAR ROM:   AROM eval  Flexion 118 P! End range   Extension 15P  Right lateral flexion 18  Left lateral flexion 18 P!  Right rotation   Left rotation    (Blank rows = not tested)  NOTE: pain referred to the L SIJ   LOWER EXTREMITY ROM:     Active  Right eval Left eval  Hip flexion    Hip extension    Hip abduction    Hip adduction    Hip internal rotation    Hip external rotation    Knee flexion    Knee extension    Ankle dorsiflexion    Ankle plantarflexion    Ankle inversion    Ankle eversion     (Blank rows = not tested)  LOWER EXTREMITY MMT:    MMT Right eval Left eval  Hip flexion 4- 3+  Hip extension 4- 3+  Hip abduction 4- 3+  Hip  adduction    Hip internal rotation    Hip external rotation    Knee flexion    Knee extension    Ankle dorsiflexion    Ankle plantarflexion    Ankle inversion    Ankle eversion     (Blank rows = not tested)  LUMBAR SPECIAL TESTS:  SI Compression/distraction test: Positive on the L and Gaenslen's test: Positive  FUNCTIONAL TESTS:  30 seconds chair stand test  GAIT: Distance walked: 110 ft to treatment room  Assistive device utilized: None Level of assistance: Complete Independence Comments: mild antalgic pattern with decreased toe off on the R and and step length on the L  TODAY'S TREATMENT:                                                                                                                               [facility] Adult PT Treatment:                                                DATE: 03/27/2023 Therapeutic Exercise: L hamstring contract/ relax 4 x 30 sec SLR with sustained quad set LLE 2  x 12 R posterior tib stretch 2 x 30 sec including Manual Therapy: MTPR along the R posterior tibialis x 2 Cross technique IASTM along the R medial calcaneal tubercle.  Neuromuscular re-ed: Gait training with heel lift in shoe 2 x 180 ft Therapeutic Activity: Assess leg length today and provided information regarding measurements and RLE being slightly shorted with LLE compensation and benefits of a heel lift.  Self Care: Provided heel lift for the R shoe and instructed how to use, what to expect and importance of changing it from shoe to shoe if she uses different shoes.   [facility] Adult PT Treatment:                                                DATE: 03/13/2023 Therapeutic Exercise: Hamstring stretch PNF contract/ relax 2 x 30 sec  SLR on the L 2 x 10  Seated calf stretch with strap 2 x 30 sec.   Provided initial HEP Manual Therapy: MTPR on the R piriformis Tack and stretch of the R piriformis Neuromuscular re-ed: Gait training utilizing heel strike/ toe off     PATIENT  EDUCATION:  Education details: evaluation findings, POC, goals, HEP with proper form/ rationale.  Person educated: Patient Education method: Explanation, Verbal cues, and Handouts Education comprehension: verbalized understanding  HOME EXERCISE PROGRAM: Access Code: [email or web address] URL: [email or web address] Date: 03/13/2023 Prepared by: Lulu Riding  Exercises - Seated Hamstring Stretch  - 1 x daily - 7 x weekly - 2 sets - 2 reps - 30 hold - SLR  - 1 x daily - 7 x weekly - 3 sets - 13 reps - 1 hold - Supine Piriformis Stretch  - 1 x daily - 7 x weekly - 2 sets - 2 reps - 30 -60 hold - Sidelying Hip Abduction  - 1 x daily - 7 x weekly - 3 sets - 15 reps - Seated Calf Stretch with Strap  - 1 x daily - 7 x weekly - 2 sets - 10 reps - 30 -60 hold  ASSESSMENT:  CLINICAL IMPRESSION: 10/21/2024Lisa arrives to session noting soreness in both the back rated at 5/10 and in the R ankle/ foot. Further assessment was performed today which based on the assessment she revealed to be slightly longer with the LLE which could explain her compensation in the RLE as well as low back pain. Continued working on SIJ on the L which she reports continued improvement and provided heel lift for the RLE at 2 layers and practiced walking/ standing. She rpoerted feeling soreness at the medial calcaneal tubercle and felt like she wasn't using her LLE but other wise felt good.   Per Evaluation: Patient is a 62 y.o. F who was seen today for physical therapy evaluation and treatment for dx of Spondylolisthesis, lumbar region. Maycee has functional trunk mobility with reported pain noted at end range flexion and extension with pain located at the L SIJ. She demonstrates gross LE weakness with MMT. Special testing suggestion SIJ involvement with L innominate anterior deficit. She noted reduction in low back pain and improvement in trunk mobility with hamstring stretching/ hip flexor activation and STW for the R  piriformis. She would benefit from physical therapy to decrease low back/ SIJ pain, improve LE strength, maximize gait/ lifting biomechanics and function by addressing the deficits listed.   OBJECTIVE IMPAIRMENTS: Abnormal gait,  decreased balance, decreased endurance, decreased ROM, decreased strength, improper body mechanics, postural dysfunction, and pain.   ACTIVITY LIMITATIONS: carrying, lifting, standing, squatting, stairs, and locomotion level  PARTICIPATION LIMITATIONS: shopping, community activity, occupation, yard work, and school  PERSONAL FACTORS: Age, Past/current experiences, Time since onset of injury/illness/exacerbation, and 1 comorbidity: hx of discectomies  are also affecting patient's functional outcome.   REHAB POTENTIAL: Good  CLINICAL DECISION MAKING: Evolving/moderate complexity  EVALUATION COMPLEXITY: Moderate   GOALS: Goals reviewed with patient? Yes  SHORT TERM GOALS: Target date: 04/10/2023  Pt to be IND with initial HEP to assist with therapeutic progression Baseline: Goal status: INITIAL  2.  Pt to verbalize/ demo efficient posture and lifting mechanics to prevent and reduce low back pain Baseline:  Goal status: INITIAL  3.  Pt to be able to sit , stand and walk for >/= 15 min with </= max of 6/10 pain  Baseline:  Goal status: INITIAL  4.  Improve FOTO score to >/48% to demo improving function Baseline:  Goal status: INITIAL  LONG TERM GOALS: Target date: 05/08/2023  Pt to be increase gross LE strength to >/=4 to promote functional strength to assist with lifting/ carrying activities as related to her job requirements Baseline:  Goal status: INITIAL  2.  Pt to be able to stand and walk for >/=1 hour and perfrom all lifting activities related to her job described as min difficult  with </= 2/10 max pain  Baseline:  Goal status: INITIAL  3.  Pt to report no referred RLE symptoms for >/= 1 week for improvement in function and QOL. Baseline:   Goal status: INITIAL  4.  Pt to improve her FOTO score to >/= 59 To demonstrate improvement in function Baseline:  Goal status: INITIAL  5.  Pt to be IND with all HEP and is able to maintain and progress her current LOF IND.  Baseline:  Goal status: INITIAL PLAN:  PT FREQUENCY: 1-2x/week  PT DURATION: 8 weeks  PLANNED INTERVENTIONS: Therapeutic exercises, Therapeutic activity, Neuromuscular re-education, Balance training, Gait training, Patient/Family education, Self Care, Joint mobilization, Joint manipulation, Stair training, Aquatic Therapy, Dry Needling, Spinal manipulation, Spinal mobilization, Cryotherapy, Moist heat, Taping, Traction, Ionotophoresis 4mg /ml Dexamethasone, Manual therapy, and Re-evaluation.  PLAN FOR NEXT SESSION: Review / update HEP PRN. Measure LLD, stretch hamstring on L/ strengthening hip flexors on the L, STW for piriformis on the R, gross hip strengthening, stair training. STW on the R calf, calf stretching. Gait training.    Rowan Pollman PT, DPT, LAT, ATC  03/27/23  12:00 PM

## 2023-03-29 ENCOUNTER — Ambulatory Visit: Payer: Managed Care, Other (non HMO) | Admitting: Physical Therapy

## 2023-04-03 ENCOUNTER — Ambulatory Visit: Payer: Managed Care, Other (non HMO) | Admitting: Physical Therapy

## 2023-04-03 DIAGNOSIS — M25571 Pain in right ankle and joints of right foot: Secondary | ICD-10-CM | POA: Diagnosis not present

## 2023-04-03 DIAGNOSIS — M5459 Other low back pain: Secondary | ICD-10-CM

## 2023-04-03 DIAGNOSIS — M6281 Muscle weakness (generalized): Secondary | ICD-10-CM

## 2023-04-03 NOTE — Therapy (Signed)
OUTPATIENT PHYSICAL THERAPY TREATMENT   Patient Name: Kerri Gonzalez MRN: [id number] DOB:1960-12-18, 62 y.o., female Today's Date: 04/03/2023  END OF SESSION:  PT End of Session - 04/03/23 0936     Visit Number 3    Number of Visits 16    Date for PT Re-Evaluation 05/08/23    Progress Note Due on Visit 10    PT Start Time 0934    PT Stop Time 1028    PT Time Calculation (min) 54 min    Activity Tolerance Patient tolerated treatment well    Behavior During Therapy [facility] for tasks assessed/performed               Past Medical History:  Diagnosis Date   Meniere disease    Ulcerative colitis (HCC)    Past Surgical History:  Procedure Laterality Date   LUMBAR DISC SURGERY  2010   LUMBAR DISC SURGERY  2013   Patient Active Problem List   Diagnosis Date Noted   Memory change 03/18/2016    PCP: Cleatis Polka., MD   REFERRING PROVIDER: Barnett Abu, MD   REFERRING DIAG: Spondylolisthesis, lumbar region [M43.16]   Rationale for Evaluation and Treatment: Rehabilitation  THERAPY DIAG:  Pain in right ankle and joints of right foot  Muscle weakness (generalized)  Other low back pain  ONSET DATE: 2010  SUBJECTIVE:                                                                                                                                                                                           SUBJECTIVE STATEMENT: "I felt so good after the last session, but I do a lot of walking at the furniture market and by day 5 I was having so having so much pain"  PERTINENT HISTORY:  Hx of multiple lumbar discectomies  PAIN:  Are you having pain? Yes: NPRS scale: 8/10 Pain location: Low back /  Pain description: intenise, aggrivating Aggravating factors: lifting , walking, stairs Relieving factors: stretching (cat cow), bird dog, heat  Are you having pain? Yes: NPRS scale: sitting 9/10 Pain location: R ankle, achilles Pain description: sore/  achilles Aggravating factors: walking/ standing steps.  Relieving factors: ice, rock tape  PRECAUTIONS: None  RED FLAGS: None   WEIGHT BEARING RESTRICTIONS: No  FALLS:  Has patient fallen in last 6 months? No  LIVING ENVIRONMENT: Lives with: lives with their family Lives in: House/apartment Stairs: Yes: Internal: 20 steps; on left going up Has following equipment at home: None  OCCUPATION: Research officer, trade union  PLOF: Independent  PATIENT GOALS: be pain free, be more active beyond pilates  OBJECTIVE:  Note: Objective measures were completed at Evaluation unless otherwise noted.  DIAGNOSTIC FINDINGS:  N/A  PATIENT SURVEYS:  FOTO 42% predicted 59%  SCREENING FOR RED FLAGS: Bowel or bladder incontinence: No Cauda equina syndrome: No   COGNITION: Overall cognitive status: Within functional limits for tasks assessed     SENSATION: Light touch: slight numbness in  R L1 compared bil   POSTURE: rounded shoulders and forward head  PALPATION: TTP along the L SIJ and R piriformis. Soreness noted along the R distal calf/ achilles into insertion.    L length assessment:  03/27/2023 True: R:81.2 cm L: 82.1 cm  Apparent: R: 89.9 L: 90.5  LUMBAR ROM:   AROM eval  Flexion 118 P! End range   Extension 15P  Right lateral flexion 18  Left lateral flexion 18 P!  Right rotation   Left rotation    (Blank rows = not tested)  NOTE: pain referred to the L SIJ   LOWER EXTREMITY ROM:     Active  Right eval Left eval  Hip flexion    Hip extension    Hip abduction    Hip adduction    Hip internal rotation    Hip external rotation    Knee flexion    Knee extension    Ankle dorsiflexion    Ankle plantarflexion    Ankle inversion    Ankle eversion     (Blank rows = not tested)  LOWER EXTREMITY MMT:    MMT Right eval Left eval  Hip flexion 4- 3+  Hip extension 4- 3+  Hip abduction 4- 3+  Hip adduction    Hip internal rotation    Hip external  rotation    Knee flexion    Knee extension    Ankle dorsiflexion    Ankle plantarflexion    Ankle inversion    Ankle eversion     (Blank rows = not tested)  LUMBAR SPECIAL TESTS:  SI Compression/distraction test: Positive on the L and Gaenslen's test: Positive  FUNCTIONAL TESTS:  30 seconds chair stand test  GAIT: Distance walked: 110 ft to treatment room  Assistive device utilized: None Level of assistance: Complete Independence Comments: mild antalgic pattern with decreased toe off on the R and and step length on the L  TODAY'S TREATMENT:                                                                                                                               [facility] Adult PT Treatment:                                                DATE: 04/03/2023 Therapeutic Exercise: Seated calf stretch with strap Hamstring stretch PNF contract/ relax 3 x 30 sec wit 10 sec contraction  Reviewed HEP and importance  of stretching frequently Manual Therapy: MTRP along the the R calf, quadratus plantae IASTM along the achilles / gastroc and soleus  Twisting along over the medial calneal tubecle SIJ MET for L hip flexor activation 5 x 10 sec contraction Therapeutic Activity: Reviewed benefit of heel lift and effect of the amount of walking she did this weekend combined with her gradually acclimating to the heel lift and how it caused her to return to prior habits/ compensation. Self Care: Addressed patient questions regarding benefits of night splints  [facility] Adult PT Treatment:                                                DATE: 03/27/2023 Therapeutic Exercise: L hamstring contract/ relax 4 x 30 sec SLR with sustained quad set LLE 2 x 12 R posterior tib stretch 2 x 30 sec including Manual Therapy: MTPR along the R posterior tibialis x 2 Cross technique IASTM along the R medial calcaneal tubercle.  Neuromuscular re-ed: Gait training with heel lift in shoe 2 x 180 ft Therapeutic  Activity: Assess leg length today and provided information regarding measurements and RLE being slightly shorted with LLE compensation and benefits of a heel lift.  Self Care: Provided heel lift for the R shoe and instructed how to use, what to expect and importance of changing it from shoe to shoe if she uses different shoes.   [facility] Adult PT Treatment:                                                DATE: 03/13/2023 Therapeutic Exercise: Hamstring stretch PNF contract/ relax 2 x 30 sec  SLR on the L 2 x 10  Seated calf stretch with strap 2 x 30 sec.   Provided initial HEP Manual Therapy: MTPR on the R piriformis Tack and stretch of the R piriformis Neuromuscular re-ed: Gait training utilizing heel strike/ toe off     PATIENT EDUCATION: 04/03/2023 Education details: Addressed pt questions regarding night splints and benefits.  Person educated: Patient Education method: Explanation, Verbal cues, and Handouts Education comprehension: verbalized understanding  HOME EXERCISE PROGRAM: Access Code: [email or web address] URL: [email or web address] Date: 03/13/2023 Prepared by: Lulu Riding  Exercises - Seated Hamstring Stretch  - 1 x daily - 7 x weekly - 2 sets - 2 reps - 30 hold - SLR  - 1 x daily - 7 x weekly - 3 sets - 13 reps - 1 hold - Supine Piriformis Stretch  - 1 x daily - 7 x weekly - 2 sets - 2 reps - 30 -60 hold - Sidelying Hip Abduction  - 1 x daily - 7 x weekly - 3 sets - 15 reps - Seated Calf Stretch with Strap  - 1 x daily - 7 x weekly - 2 sets - 10 reps - 30 -60 hold  ASSESSMENT:  CLINICAL IMPRESSION: 04/03/2023 Mrs Odoms reports following previous session she felt great for a day or so then when went to the furniture market and gradually noted increasing soreness and pain with the increasing amount of mileage she did over the course of 5 days which resulted in taking out the heel lift in her shoe. Today she noted increased pain in both her foot/  ankle and her  back. Focused session primarily on the manual techniques to reduce R calf tightness/ foot pain, and MET techniques for the low back with emphasis on L hamstring stretching and R hip flexor activation.   Per Evaluation: Patient is a 62 y.o. F who was seen today for physical therapy evaluation and treatment for dx of Spondylolisthesis, lumbar region. Chasie has functional trunk mobility with reported pain noted at end range flexion and extension with pain located at the L SIJ. She demonstrates gross LE weakness with MMT. Special testing suggestion SIJ involvement with L innominate anterior deficit. She noted reduction in low back pain and improvement in trunk mobility with hamstring stretching/ hip flexor activation and STW for the R piriformis. She would benefit from physical therapy to decrease low back/ SIJ pain, improve LE strength, maximize gait/ lifting biomechanics and function by addressing the deficits listed.   OBJECTIVE IMPAIRMENTS: Abnormal gait, decreased balance, decreased endurance, decreased ROM, decreased strength, improper body mechanics, postural dysfunction, and pain.   ACTIVITY LIMITATIONS: carrying, lifting, standing, squatting, stairs, and locomotion level  PARTICIPATION LIMITATIONS: shopping, community activity, occupation, yard work, and school  PERSONAL FACTORS: Age, Past/current experiences, Time since onset of injury/illness/exacerbation, and 1 comorbidity: hx of discectomies  are also affecting patient's functional outcome.   REHAB POTENTIAL: Good  CLINICAL DECISION MAKING: Evolving/moderate complexity  EVALUATION COMPLEXITY: Moderate   GOALS: Goals reviewed with patient? Yes  SHORT TERM GOALS: Target date: 04/10/2023  Pt to be IND with initial HEP to assist with therapeutic progression Baseline: Goal status: INITIAL  2.  Pt to verbalize/ demo efficient posture and lifting mechanics to prevent and reduce low back pain Baseline:  Goal status: INITIAL  3.  Pt to  be able to sit , stand and walk for >/= 15 min with </= max of 6/10 pain  Baseline:  Goal status: INITIAL  4.  Improve FOTO score to >/48% to demo improving function Baseline:  Goal status: INITIAL  LONG TERM GOALS: Target date: 05/08/2023  Pt to be increase gross LE strength to >/=4 to promote functional strength to assist with lifting/ carrying activities as related to her job requirements Baseline:  Goal status: INITIAL  2.  Pt to be able to stand and walk for >/=1 hour and perfrom all lifting activities related to her job described as min difficult  with </= 2/10 max pain  Baseline:  Goal status: INITIAL  3.  Pt to report no referred RLE symptoms for >/= 1 week for improvement in function and QOL. Baseline:  Goal status: INITIAL  4.  Pt to improve her FOTO score to >/= 59 To demonstrate improvement in function Baseline:  Goal status: INITIAL  5.  Pt to be IND with all HEP and is able to maintain and progress her current LOF IND.  Baseline:  Goal status: INITIAL PLAN:  PT FREQUENCY: 1-2x/week  PT DURATION: 8 weeks  PLANNED INTERVENTIONS: Therapeutic exercises, Therapeutic activity, Neuromuscular re-education, Balance training, Gait training, Patient/Family education, Self Care, Joint mobilization, Joint manipulation, Stair training, Aquatic Therapy, Dry Needling, Spinal manipulation, Spinal mobilization, Cryotherapy, Moist heat, Taping, Traction, Ionotophoresis 4mg /ml Dexamethasone, Manual therapy, and Re-evaluation.  PLAN FOR NEXT SESSION: Review / update HEP PRN. Measure LLD, stretch hamstring on L/ strengthening hip flexors on the L, STW for piriformis on the R, gross hip strengthening, stair training. STW on the R calf, calf stretching. Gait training.    Khairi Garman PT, DPT, LAT, ATC  04/03/23  10:47 AM

## 2023-04-05 ENCOUNTER — Ambulatory Visit: Payer: Managed Care, Other (non HMO) | Admitting: Physical Therapy

## 2023-04-05 ENCOUNTER — Other Ambulatory Visit (HOSPITAL_COMMUNITY): Payer: Self-pay

## 2023-04-10 ENCOUNTER — Other Ambulatory Visit (HOSPITAL_COMMUNITY): Payer: Self-pay

## 2023-04-11 ENCOUNTER — Ambulatory Visit: Payer: Managed Care, Other (non HMO) | Attending: Neurological Surgery | Admitting: Physical Therapy

## 2023-04-11 ENCOUNTER — Encounter: Payer: Self-pay | Admitting: Physical Therapy

## 2023-04-11 DIAGNOSIS — M25571 Pain in right ankle and joints of right foot: Secondary | ICD-10-CM | POA: Diagnosis present

## 2023-04-11 DIAGNOSIS — M6281 Muscle weakness (generalized): Secondary | ICD-10-CM | POA: Diagnosis present

## 2023-04-11 DIAGNOSIS — M5459 Other low back pain: Secondary | ICD-10-CM | POA: Insufficient documentation

## 2023-04-11 NOTE — Therapy (Signed)
OUTPATIENT PHYSICAL THERAPY TREATMENT   Patient Name: Kerri Gonzalez MRN: [id number] DOB:Mar 02, 1961, 62 y.o., female Today's Date: 04/11/2023  END OF SESSION:  PT End of Session - 04/11/23 0933     Visit Number 4    Number of Visits 16    Date for PT Re-Evaluation 05/08/23    Authorization Type [employer]    Progress Note Due on Visit 10    PT Start Time 0933    PT Stop Time 1015    PT Time Calculation (min) 42 min    Activity Tolerance Patient tolerated treatment well    Behavior During Therapy [facility] for tasks assessed/performed                Past Medical History:  Diagnosis Date   Meniere disease    Ulcerative colitis (HCC)    Past Surgical History:  Procedure Laterality Date   LUMBAR DISC SURGERY  2010   LUMBAR DISC SURGERY  2013   Patient Active Problem List   Diagnosis Date Noted   Memory change 03/18/2016    PCP: Cleatis Polka., MD   REFERRING PROVIDER: Barnett Abu, MD   REFERRING DIAG: Spondylolisthesis, lumbar region [M43.16]   Rationale for Evaluation and Treatment: Rehabilitation  THERAPY DIAG:  Pain in right ankle and joints of right foot  Muscle weakness (generalized)  Other low back pain  ONSET DATE: 2010  SUBJECTIVE:                                                                                                                                                                                           SUBJECTIVE STATEMENT: "I am doing better today. I have thrown everything at it, stretching, I got a night splint, and inserts in my shoes."  PERTINENT HISTORY:  Hx of multiple lumbar discectomies  PAIN:  Are you having pain? Yes: NPRS scale: 7/10 Pain location: Low back /  Pain description: intenise, aggrivating Aggravating factors: lifting , walking, stairs Relieving factors: stretching (cat cow), bird dog, heat  Are you having pain? Yes: NPRS scale: sitting 5/10 Pain location: R ankle, achilles Pain description: sore/  achilles Aggravating factors: walking/ standing steps.  Relieving factors: ice, rock tape  PRECAUTIONS: None  RED FLAGS: None   WEIGHT BEARING RESTRICTIONS: No  FALLS:  Has patient fallen in last 6 months? No  LIVING ENVIRONMENT: Lives with: lives with their family Lives in: House/apartment Stairs: Yes: Internal: 20 steps; on left going up Has following equipment at home: None  OCCUPATION: Research officer, trade union  PLOF: Independent  PATIENT GOALS: be pain free, be more active beyond pilates   OBJECTIVE:  Note: Objective measures were completed at Evaluation unless otherwise noted.  DIAGNOSTIC FINDINGS:  N/A  PATIENT SURVEYS:  FOTO 42% predicted 59%  SCREENING FOR RED FLAGS: Bowel or bladder incontinence: No Cauda equina syndrome: No   COGNITION: Overall cognitive status: Within functional limits for tasks assessed     SENSATION: Light touch: slight numbness in  R L1 compared bil   POSTURE: rounded shoulders and forward head  PALPATION: TTP along the L SIJ and R piriformis. Soreness noted along the R distal calf/ achilles into insertion.    L length assessment:  03/27/2023 True: R:81.2 cm L: 82.1 cm  Apparent: R: 89.9 L: 90.5  LUMBAR ROM:   AROM eval  Flexion 118 P! End range   Extension 15P  Right lateral flexion 18  Left lateral flexion 18 P!  Right rotation   Left rotation    (Blank rows = not tested)  NOTE: pain referred to the L SIJ   LOWER EXTREMITY ROM:     Active  Right eval Left eval  Hip flexion    Hip extension    Hip abduction    Hip adduction    Hip internal rotation    Hip external rotation    Knee flexion    Knee extension    Ankle dorsiflexion    Ankle plantarflexion    Ankle inversion    Ankle eversion     (Blank rows = not tested)  LOWER EXTREMITY MMT:    MMT Right eval Left eval  Hip flexion 4- 3+  Hip extension 4- 3+  Hip abduction 4- 3+  Hip adduction    Hip internal rotation    Hip external  rotation    Knee flexion    Knee extension    Ankle dorsiflexion    Ankle plantarflexion    Ankle inversion    Ankle eversion     (Blank rows = not tested)  LUMBAR SPECIAL TESTS:  SI Compression/distraction test: Positive on the L and Gaenslen's test: Positive  FUNCTIONAL TESTS:  30 seconds chair stand test  GAIT: Distance walked: 110 ft to treatment room  Assistive device utilized: None Level of assistance: Complete Independence Comments: mild antalgic pattern with decreased toe off on the R and and step length on the L  TODAY'S TREATMENT:                                                                                                                               [facility] Adult PT Treatment:                                                DATE: 04/11/2023 Therapeutic Exercise: L ITB stretch 2 x30  Seated QL stretch on the L 1 x 30 sec Standing R foot rockerboard ankle inversion/ eversion 1 x  20 Standing rockerboard PF/DF 1 x 20 Manual Therapy: MTPR along the glute med/ min, piriformis, and QL on the R Tack and stretch of the L piriformis   [facility] Adult PT Treatment:                                                DATE: 04/03/2023 Therapeutic Exercise: Seated calf stretch with strap Hamstring stretch PNF contract/ relax 3 x 30 sec wit 10 sec contraction  Reviewed HEP and importance of stretching frequently Manual Therapy: MTRP along the the R calf, quadratus plantae IASTM along the achilles / gastroc and soleus  Twisting along over the medial calneal tubecle SIJ MET for L hip flexor activation 5 x 10 sec contraction Therapeutic Activity: Reviewed benefit of heel lift and effect of the amount of walking she did this weekend combined with her gradually acclimating to the heel lift and how it caused her to return to prior habits/ compensation. Self Care: Addressed patient questions regarding benefits of night splints  [facility] Adult PT Treatment:                                                 DATE: 03/27/2023 Therapeutic Exercise: L hamstring contract/ relax 4 x 30 sec SLR with sustained quad set LLE 2 x 12 R posterior tib stretch 2 x 30 sec including Manual Therapy: MTPR along the R posterior tibialis x 2 Cross technique IASTM along the R medial calcaneal tubercle.  Neuromuscular re-ed: Gait training with heel lift in shoe 2 x 180 ft Therapeutic Activity: Assess leg length today and provided information regarding measurements and RLE being slightly shorted with LLE compensation and benefits of a heel lift.  Self Care: Provided heel lift for the R shoe and instructed how to use, what to expect and importance of changing it from shoe to shoe if she uses different shoes.    PATIENT EDUCATION: 04/03/2023 Education details: Addressed pt questions regarding night splints and benefits.  Person educated: Patient Education method: Explanation, Verbal cues, and Handouts Education comprehension: verbalized understanding  HOME EXERCISE PROGRAM: Access Code: [email or web address] URL: [email or web address] Date: 03/13/2023 Prepared by: Lulu Riding  Exercises - Seated Hamstring Stretch  - 1 x daily - 7 x weekly - 2 sets - 2 reps - 30 hold - SLR  - 1 x daily - 7 x weekly - 3 sets - 13 reps - 1 hold - Supine Piriformis Stretch  - 1 x daily - 7 x weekly - 2 sets - 2 reps - 30 -60 hold - Sidelying Hip Abduction  - 1 x daily - 7 x weekly - 3 sets - 15 reps - Seated Calf Stretch with Strap  - 1 x daily - 7 x weekly - 2 sets - 10 reps - 30 -60 hold  ASSESSMENT:  CLINICAL IMPRESSION: 04/11/2023 Kerri Gonzalez is doing well with physical therapy and is doing a good amount at home which is helping with the night splint and in shoe orthotics. She does note some soreness with the orthotics as a result of using muscles she hasn't which has increased her hip / QL soreness on the L. She continues to respond well to manual techniques to relieve tension in the  hip, and focus on ankle  talocrural movements/ support. End of session she reported feeling much better compared to when she arrived.   Per Evaluation: Patient is a 62 y.o. F who was seen today for physical therapy evaluation and treatment for dx of Spondylolisthesis, lumbar region. Kerri Gonzalez has functional trunk mobility with reported pain noted at end range flexion and extension with pain located at the L SIJ. She demonstrates gross LE weakness with MMT. Special testing suggestion SIJ involvement with L innominate anterior deficit. She noted reduction in low back pain and improvement in trunk mobility with hamstring stretching/ hip flexor activation and STW for the R piriformis. She would benefit from physical therapy to decrease low back/ SIJ pain, improve LE strength, maximize gait/ lifting biomechanics and function by addressing the deficits listed.   OBJECTIVE IMPAIRMENTS: Abnormal gait, decreased balance, decreased endurance, decreased ROM, decreased strength, improper body mechanics, postural dysfunction, and pain.   ACTIVITY LIMITATIONS: carrying, lifting, standing, squatting, stairs, and locomotion level  PARTICIPATION LIMITATIONS: shopping, community activity, occupation, yard work, and school  PERSONAL FACTORS: Age, Past/current experiences, Time since onset of injury/illness/exacerbation, and 1 comorbidity: hx of discectomies  are also affecting patient's functional outcome.   REHAB POTENTIAL: Good  CLINICAL DECISION MAKING: Evolving/moderate complexity  EVALUATION COMPLEXITY: Moderate   GOALS: Goals reviewed with patient? Yes  SHORT TERM GOALS: Target date: 04/10/2023  Pt to be IND with initial HEP to assist with therapeutic progression Baseline: Goal status: INITIAL  2.  Pt to verbalize/ demo efficient posture and lifting mechanics to prevent and reduce low back pain Baseline:  Goal status: INITIAL  3.  Pt to be able to sit , stand and walk for >/= 15 min with </= max of 6/10 pain  Baseline:  Goal  status: INITIAL  4.  Improve FOTO score to >/48% to demo improving function Baseline:  Goal status: INITIAL  LONG TERM GOALS: Target date: 05/08/2023  Pt to be increase gross LE strength to >/=4 to promote functional strength to assist with lifting/ carrying activities as related to her job requirements Baseline:  Goal status: INITIAL  2.  Pt to be able to stand and walk for >/=1 hour and perfrom all lifting activities related to her job described as min difficult  with </= 2/10 max pain  Baseline:  Goal status: INITIAL  3.  Pt to report no referred RLE symptoms for >/= 1 week for improvement in function and QOL. Baseline:  Goal status: INITIAL  4.  Pt to improve her FOTO score to >/= 59 To demonstrate improvement in function Baseline:  Goal status: INITIAL  5.  Pt to be IND with all HEP and is able to maintain and progress her current LOF IND.  Baseline:  Goal status: INITIAL PLAN:  PT FREQUENCY: 1-2x/week  PT DURATION: 8 weeks  PLANNED INTERVENTIONS: Therapeutic exercises, Therapeutic activity, Neuromuscular re-education, Balance training, Gait training, Patient/Family education, Self Care, Joint mobilization, Joint manipulation, Stair training, Aquatic Therapy, Dry Needling, Spinal manipulation, Spinal mobilization, Cryotherapy, Moist heat, Taping, Traction, Ionotophoresis 4mg /ml Dexamethasone, Manual therapy, and Re-evaluation.  PLAN FOR NEXT SESSION: Review / update HEP PRN. Measure LLD, stretch hamstring on L/ strengthening hip flexors on the L, STW for piriformis on the R, gross hip strengthening, stair training. STW on the R calf, calf stretching. Gait training.    Sheccid Lahmann PT, DPT, LAT, ATC  04/11/23  11:13 AM

## 2023-04-13 ENCOUNTER — Ambulatory Visit: Payer: Managed Care, Other (non HMO) | Admitting: Physical Therapy

## 2023-04-13 ENCOUNTER — Encounter: Payer: Self-pay | Admitting: Physical Therapy

## 2023-04-13 DIAGNOSIS — M25571 Pain in right ankle and joints of right foot: Secondary | ICD-10-CM | POA: Diagnosis not present

## 2023-04-13 DIAGNOSIS — M6281 Muscle weakness (generalized): Secondary | ICD-10-CM

## 2023-04-13 DIAGNOSIS — M5459 Other low back pain: Secondary | ICD-10-CM

## 2023-04-13 NOTE — Therapy (Signed)
OUTPATIENT PHYSICAL THERAPY TREATMENT   Patient Name: Kerri Gonzalez MRN: [id number] DOB:10-10-1960, 62 y.o., female Today's Date: 04/13/2023  END OF SESSION:  PT End of Session - 04/13/23 1334     Visit Number 5    Number of Visits 16    Date for PT Re-Evaluation 05/08/23    Authorization Type [employer]    Progress Note Due on Visit 10    PT Start Time 1332    PT Stop Time 1415    PT Time Calculation (min) 43 min    Activity Tolerance Patient tolerated treatment well    Behavior During Therapy [facility] for tasks assessed/performed                 Past Medical History:  Diagnosis Date   Meniere disease    Ulcerative colitis (HCC)    Past Surgical History:  Procedure Laterality Date   LUMBAR DISC SURGERY  2010   LUMBAR DISC SURGERY  2013   Patient Active Problem List   Diagnosis Date Noted   Memory change 03/18/2016    PCP: Cleatis Polka., MD   REFERRING PROVIDER: Barnett Abu, MD   REFERRING DIAG: Spondylolisthesis, lumbar region [M43.16]   Rationale for Evaluation and Treatment: Rehabilitation  THERAPY DIAG:  Pain in right ankle and joints of right foot  Muscle weakness (generalized)  Other low back pain  ONSET DATE: 2010  SUBJECTIVE:                                                                                                                                                                                           SUBJECTIVE STATEMENT: "The back is doing better, the foot is feeling a little worse.   PERTINENT HISTORY:  Hx of multiple lumbar discectomies  PAIN:  Back Are you having pain? Yes: NPRS scale: 0/10 Pain location: Low back /  Pain description: intenise, aggrivating Aggravating factors: lifting , walking, stairs Relieving factors: stretching (cat cow), bird dog, heat  ankle Are you having pain? Yes: NPRS scale: 6/10 Pain location: R ankle, achilles Pain description: sore/ achilles Aggravating factors: walking/ standing steps.   Relieving factors: ice, rock tape  PRECAUTIONS: None  RED FLAGS: None   WEIGHT BEARING RESTRICTIONS: No  FALLS:  Has patient fallen in last 6 months? No  LIVING ENVIRONMENT: Lives with: lives with their family Lives in: House/apartment Stairs: Yes: Internal: 20 steps; on left going up Has following equipment at home: None  OCCUPATION: Research officer, [employer]  PLOF: Independent  PATIENT GOALS: be pain free, be more active beyond pilates   OBJECTIVE:  Note: Objective measures were completed at Evaluation  unless otherwise noted.  DIAGNOSTIC FINDINGS:  N/A  PATIENT SURVEYS:  FOTO 42% predicted 59%  SCREENING FOR RED FLAGS: Bowel or bladder incontinence: No Cauda equina syndrome: No   COGNITION: Overall cognitive status: Within functional limits for tasks assessed     SENSATION: Light touch: slight numbness in  R L1 compared bil   POSTURE: rounded shoulders and forward head  PALPATION: TTP along the L SIJ and R piriformis. Soreness noted along the R distal calf/ achilles into insertion.    L length assessment:  03/27/2023 True: R:81.2 cm L: 82.1 cm  Apparent: R: 89.9 L: 90.5  LUMBAR ROM:   AROM eval  Flexion 118 P! End range   Extension 15P  Right lateral flexion 18  Left lateral flexion 18 P!  Right rotation   Left rotation    (Blank rows = not tested)  NOTE: pain referred to the L SIJ   LOWER EXTREMITY ROM:     Active  Right eval Left eval  Hip flexion    Hip extension    Hip abduction    Hip adduction    Hip internal rotation    Hip external rotation    Knee flexion    Knee extension    Ankle dorsiflexion    Ankle plantarflexion    Ankle inversion    Ankle eversion     (Blank rows = not tested)  LOWER EXTREMITY MMT:    MMT Right eval Left eval  Hip flexion 4- 3+  Hip extension 4- 3+  Hip abduction 4- 3+  Hip adduction    Hip internal rotation    Hip external rotation    Knee flexion    Knee extension    Ankle  dorsiflexion    Ankle plantarflexion    Ankle inversion    Ankle eversion     (Blank rows = not tested)  LUMBAR SPECIAL TESTS:  SI Compression/distraction test: Positive on the L and Gaenslen's test: Positive  FUNCTIONAL TESTS:  30 seconds chair stand test  GAIT: Distance walked: 110 ft to treatment room  Assistive device utilized: None Level of assistance: Complete Independence Comments: mild antalgic pattern with decreased toe off on the R and and step length on the L  TODAY'S TREATMENT:                                                                                                                               [facility] Adult PT Treatment:                                                DATE: 04/13/2023 Therapeutic Exercise: Elliptical L1 x ramp L1x 5 min  Slant board calf stretch  knee ext/ flex 2 x 30 sec Bil heel raise on slant board 2 x 12 with ball squeeze  for posterior tib actiation Toe yoga 1 x 10 holding 5 sec Towel scrunch x 2 bil LE  Updated HEP for toe yoga and towel scrunch Manual Therapy: IASTM along the quaratus planteae on the R foot Talocrural LAD grade V with cavitation noted.   Trigger Point Dry-Needling  Treatment instructions: Expect mild to moderate muscle soreness. S/S of pneumothorax if dry needled over a lung field, and to seek immediate medical attention should they occur. Patient verbalized understanding of these instructions and education.  Patient Consent Given: Yes Education handout provided: Yes Muscles treated: R quadratus plantae Electrical stimulation performed: No Parameters: N/A Treatment response/outcome: twitch response noted, reduction of tension  [facility] Adult PT Treatment:                                                DATE: 04/11/2023 Therapeutic Exercise: L ITB stretch 2 x30  Seated QL stretch on the L 1 x 30 sec Standing R foot rockerboard ankle inversion/ eversion 1 x 20 Standing rockerboard PF/DF 1 x 20 Manual Therapy: MTPR along  the glute med/ min, piriformis, and QL on the R Tack and stretch of the L piriformis   [facility] Adult PT Treatment:                                                DATE: 04/03/2023 Therapeutic Exercise: Seated calf stretch with strap Hamstring stretch PNF contract/ relax 3 x 30 sec wit 10 sec contraction  Reviewed HEP and importance of stretching frequently Manual Therapy: MTRP along the the R calf, quadratus plantae IASTM along the achilles / gastroc and soleus  Twisting along over the medial calneal tubecle SIJ MET for L hip flexor activation 5 x 10 sec contraction Therapeutic Activity: Reviewed benefit of heel lift and effect of the amount of walking she did this weekend combined with her gradually acclimating to the heel lift and how it caused her to return to prior habits/ compensation. Self Care: Addressed patient questions regarding benefits of night splints  PATIENT EDUCATION: 04/03/2023 Education details: Addressed pt questions regarding night splints and benefits.  Person educated: Patient Education method: Explanation, Verbal cues, and Handouts Education comprehension: verbalized understanding  HOME EXERCISE PROGRAM: Access Code: [email or web address] URL: [email or web address] Date: 04/13/2023 Prepared by: Lulu Riding  Exercises - Seated Hamstring Stretch (Mirrored)  - 1 x daily - 7 x weekly - 2 sets - 2 reps - 30 hold - SLR  - 1 x daily - 7 x weekly - 3 sets - 13 reps - 1 hold - Supine Piriformis Stretch  - 1 x daily - 7 x weekly - 2 sets - 2 reps - 30 -60 hold - Sidelying Hip Abduction  - 1 x daily - 7 x weekly - 3 sets - 15 reps - Seated Calf Stretch with Strap  - 1 x daily - 7 x weekly - 2 sets - 10 reps - 30 -60 hold - Toe Yoga - Alternating Great Toe and Lesser Toe Extension  - 1 x daily - 7 x weekly - 2 sets - 10 reps - Towel Scrunches  - 1 x daily - 7 x weekly - 5 sets  ASSESSMENT:  CLINICAL IMPRESSION: 04/13/2023 mrs Tech arrives to  PT noting her  back continues to feel great but her R foot is still giving her issues. Educated and consent was provided for TPDN focusing on the R quadratus plantae followed with IASTM techniques and talocrural distraction manipulation. Continued working on both extrinsic and intrinsic strengthening which she did well with. Updated HEP today for intrinsic activation which she did well with. End of session she felt better noting some soreness in the foot likely where the TPDN was performed which was explained was normal and expected.   Per Evaluation: Patient is a 62 y.o. F who was seen today for physical therapy evaluation and treatment for dx of Spondylolisthesis, lumbar region. Lular has functional trunk mobility with reported pain noted at end range flexion and extension with pain located at the L SIJ. She demonstrates gross LE weakness with MMT. Special testing suggestion SIJ involvement with L innominate anterior deficit. She noted reduction in low back pain and improvement in trunk mobility with hamstring stretching/ hip flexor activation and STW for the R piriformis. She would benefit from physical therapy to decrease low back/ SIJ pain, improve LE strength, maximize gait/ lifting biomechanics and function by addressing the deficits listed.   OBJECTIVE IMPAIRMENTS: Abnormal gait, decreased balance, decreased endurance, decreased ROM, decreased strength, improper body mechanics, postural dysfunction, and pain.   ACTIVITY LIMITATIONS: carrying, lifting, standing, squatting, stairs, and locomotion level  PARTICIPATION LIMITATIONS: shopping, community activity, occupation, yard work, and school  PERSONAL FACTORS: Age, Past/current experiences, Time since onset of injury/illness/exacerbation, and 1 comorbidity: hx of discectomies  are also affecting patient's functional outcome.   REHAB POTENTIAL: Good  CLINICAL DECISION MAKING: Evolving/moderate complexity  EVALUATION COMPLEXITY: Moderate   GOALS: Goals  reviewed with patient? Yes  SHORT TERM GOALS: Target date: 04/10/2023  Pt to be IND with initial HEP to assist with therapeutic progression Baseline: Goal status: Met on 04/13/2023  2.  Pt to verbalize/ demo efficient posture and lifting mechanics to prevent and reduce low back pain Baseline:  Goal status: progressing 04/13/2023  3.  Pt to be able to sit , stand and walk for >/= 15 min with </= max of 6/10 pain  Baseline:  Goal status: progressing 04/13/2023  4.  Improve FOTO score to >/48% to demo improving function Baseline:  Goal status: not reassessed on 04/13/2023  LONG TERM GOALS: Target date: 05/08/2023  Pt to be increase gross LE strength to >/=4 to promote functional strength to assist with lifting/ carrying activities as related to her job requirements Baseline:  Goal status: INITIAL  2.  Pt to be able to stand and walk for >/=1 hour and perfrom all lifting activities related to her job described as min difficult  with </= 2/10 max pain  Baseline:  Goal status: INITIAL  3.  Pt to report no referred RLE symptoms for >/= 1 week for improvement in function and QOL. Baseline:  Goal status: INITIAL  4.  Pt to improve her FOTO score to >/= 59 To demonstrate improvement in function Baseline:  Goal status: INITIAL  5.  Pt to be IND with all HEP and is able to maintain and progress her current LOF IND.  Baseline:  Goal status: INITIAL PLAN:  PT FREQUENCY: 1-2x/week  PT DURATION: 8 weeks  PLANNED INTERVENTIONS: Therapeutic exercises, Therapeutic activity, Neuromuscular re-education, Balance training, Gait training, Patient/Family education, Self Care, Joint mobilization, Joint manipulation, Stair training, Aquatic Therapy, Dry Needling, Spinal manipulation, Spinal mobilization, Cryotherapy, Moist heat, Taping, Traction, Ionotophoresis 4mg /ml Dexamethasone, Manual therapy, and Re-evaluation.  PLAN FOR NEXT SESSION: Review / update HEP PRN. Measure LLD, stretch hamstring on  L/ strengthening hip flexors on the L, STW for piriformis on the R, gross hip strengthening, stair training. STW on the R calf, calf stretching. Gait training. Reassess FOTO   Titan Karner PT, DPT, LAT, ATC  04/13/23  3:21 PM

## 2023-04-13 NOTE — Patient Instructions (Signed)

## 2023-04-19 ENCOUNTER — Ambulatory Visit: Payer: Managed Care, Other (non HMO) | Admitting: Physical Therapy

## 2023-04-19 DIAGNOSIS — M25571 Pain in right ankle and joints of right foot: Secondary | ICD-10-CM

## 2023-04-19 DIAGNOSIS — M6281 Muscle weakness (generalized): Secondary | ICD-10-CM

## 2023-04-19 DIAGNOSIS — M5459 Other low back pain: Secondary | ICD-10-CM

## 2023-04-19 NOTE — Therapy (Signed)
OUTPATIENT PHYSICAL THERAPY TREATMENT   Patient Name: Kerri Gonzalez MRN: [id number] DOB:[birth year], 62 y.o., female Today's Date: 04/19/2023  END OF SESSION:  PT End of Session - 04/19/23 1017     Visit Number 6    Number of Visits 16    Date for PT Re-Evaluation 05/08/23    Authorization Type [employer]    Progress Note Due on Visit 10    PT Start Time 1017    PT Stop Time 1102    PT Time Calculation (min) 45 min    Activity Tolerance Patient tolerated treatment well;Patient limited by pain    Behavior During Therapy WFL for tasks assessed/performed                  Past Medical History:  Diagnosis Date   Meniere disease    Ulcerative colitis (HCC)    Past Surgical History:  Procedure Laterality Date   LUMBAR DISC SURGERY  2010   LUMBAR DISC SURGERY  2013   Patient Active Problem List   Diagnosis Date Noted   Memory change 03/18/2016    PCP: Cleatis Polka., MD   REFERRING PROVIDER: Barnett Abu, MD   REFERRING DIAG: Spondylolisthesis, lumbar region [M43.16]   Rationale for Evaluation and Treatment: Rehabilitation  THERAPY DIAG:  Pain in right ankle and joints of right foot  Muscle weakness (generalized)  Other low back pain  ONSET DATE: 2010  SUBJECTIVE:                                                                                                                                                                                           SUBJECTIVE STATEMENT: "I felt good after the last session but the pain in my foot got worse that night and it swelled that evening and it was throbbing which required me taking medication to calm the pain."  PERTINENT HISTORY:  Hx of multiple lumbar discectomies  PAIN:  Back Are you having pain? Yes: NPRS scale: 6/10 Pain location: Low back /  Pain description: intenise, aggrivating Aggravating factors: lifting , walking, stairs Relieving factors: stretching (cat cow), bird dog, heat  ankle Are you  having pain? Yes: NPRS scale: 8/10 Pain location: R ankle, achilles Pain description: sore/ achilles Aggravating factors: walking/ standing steps.  Relieving factors: ice, rock tape  PRECAUTIONS: None  RED FLAGS: None   WEIGHT BEARING RESTRICTIONS: No  FALLS:  Has patient fallen in last 6 months? 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  DIAGNOSTIC FINDINGS:  N/A  PATIENT SURVEYS:  FOTO 42% predicted 59% 04/19/2023 48%   SCREENING FOR RED FLAGS: Bowel or bladder incontinence: No Cauda equina syndrome: No   COGNITION: Overall cognitive status: Within functional limits for tasks assessed     SENSATION: Light touch: slight numbness in  R L1 compared bil   POSTURE: rounded shoulders and forward head  PALPATION: TTP along the L SIJ and R piriformis. Soreness noted along the R distal calf/ achilles into insertion.    L length assessment:  03/27/2023 True: R:81.2 cm L: 82.1 cm  Apparent: R: 89.9 L: 90.5  LUMBAR ROM:   AROM eval  Flexion 118 P! End range   Extension 15P  Right lateral flexion 18  Left lateral flexion 18 P!  Right rotation   Left rotation    (Blank rows = not tested)  NOTE: pain referred to the L SIJ   LOWER EXTREMITY ROM:     Active  Right eval Left eval  Hip flexion    Hip extension    Hip abduction    Hip adduction    Hip internal rotation    Hip external rotation    Knee flexion    Knee extension    Ankle dorsiflexion    Ankle plantarflexion    Ankle inversion    Ankle eversion     (Blank rows = not tested)  LOWER EXTREMITY MMT:    MMT Right eval Left eval Right 04/19/23  Left 04/19/23  Hip flexion 4- 3+    Hip extension 4- 3+    Hip abduction 4- 3+    Hip adduction      Hip internal rotation      Hip external rotation      Knee flexion      Knee extension      Ankle dorsiflexion      Ankle plantarflexion      Ankle inversion      Ankle eversion       (Blank rows = not tested)  LUMBAR SPECIAL TESTS:  SI Compression/distraction test: Positive on the L and Gaenslen's test: Positive  FUNCTIONAL TESTS:  30 seconds chair stand test  GAIT: Distance walked: 110 ft to treatment room  Assistive device utilized: None Level of assistance: Complete Independence Comments: mild antalgic pattern with decreased toe off on the R and and step length on the L  TREATMENT:                                                                                                                               [facility] Adult PT Treatment:  DATE: 04/19/2023 Therapeutic Exercise: Calf stretch knee extending contract/ relax  Manual Therapy: MTRP along the posterior tib on the R, and medial soleus Talorcrural LAD grade IV oscillations Desensitization of the medial aspect of the ankle with towel   Therapeutic Activity: Reviewed FOTO assessment and discussed improvement in score and how it relates to her function and modifications to ADLs while maintaining efficient form is a good continued to progress with activity  Self Care: Reviewed posture in the way of lifting related to using a golfers lift  Discussed Chronic pain and nerve adaptation and benefits of techniques of treatment/ exercises  [facility] Adult PT Treatment:                                                DATE: 04/13/2023 Therapeutic Exercise: Elliptical L1 x ramp L1x 5 min  Slant board calf stretch  knee ext/ flex 2 x 30 sec Bil heel raise on slant board 2 x 12 with ball squeeze for posterior tib actiation Toe yoga 1 x 10 holding 5 sec Towel scrunch x 2 bil LE  Updated HEP  for toe yoga and towel scrunch Manual Therapy: IASTM along the quaratus planteae on the R foot Talocrural LAD grade V with cavitation noted.   Trigger Point Dry-Needling  Treatment instructions: Expect mild to moderate muscle soreness. S/S of pneumothorax if dry needled over a lung field, and to seek immediate medical attention should they occur. Patient verbalized understanding of these instructions and education.  Patient Consent Given: Yes Education handout provided: Yes Muscles treated: R quadratus plantae Electrical stimulation performed: No Parameters: N/A Treatment response/outcome: twitch response noted, reduction of tension  [facility] Adult PT Treatment:                                                DATE: 04/11/2023 Therapeutic Exercise: L ITB stretch 2 x30  Seated QL stretch on the L 1 x 30 sec Standing R foot rockerboard ankle inversion/ eversion 1 x 20 Standing rockerboard PF/DF 1 x 20 Manual Therapy: MTPR along the glute med/ min, piriformis, and QL on the R Tack and stretch of the L piriformis   PATIENT EDUCATION: 04/19/2023 Education details: discussed using a frozen water bottle and rolling on the floor on her foot to help with relief on the PF and ice for pain relief.  Person educated: Patient Education method: Explanation, Verbal cues, and Handouts Education comprehension: verbalized understanding  HOME EXERCISE PROGRAM: Access Code: [email or web address] URL: [email or web address] Date: 04/13/2023 Prepared by: Lulu Riding  Exercises - Seated Hamstring Stretch (Mirrored)  - 1 x daily - 7 x weekly - 2 sets - 2 reps - 30 hold - SLR  - 1 x daily - 7 x weekly - 3 sets - 13 reps - 1 hold - Supine Piriformis Stretch  - 1 x daily - 7 x weekly - 2 sets - 2 reps - 30 -60 hold - Sidelying Hip Abduction  - 1 x daily - 7 x weekly - 3 sets - 15 reps - Seated Calf Stretch with Strap  - 1 x daily - 7 x weekly - 2 sets - 10 reps - 30 -60 hold - Toe Yoga - Alternating  Great Toe and  Lesser Toe Extension  - 1 x daily - 7 x weekly - 2 sets - 10 reps - Towel Scrunches  - 1 x daily - 7 x weekly - 5 sets  ASSESSMENT:  CLINICAL IMPRESSION: 04/19/2023 Mrs Bridger arrives to session today noting increased soreness in the R medial foot/ ankle noting it had been present since the evening following her last session. She reports continued back pain as well however has trouble noting it due to the level at which her foot/ ankle is at today. Despite continued pain she is making progress per her FOTO assessment in her back today which improved 6 points oint 48%. Time was taken to address her foot/ ankle pain, and discussed during session PNE and nerve adaption/ desensitization techniques. End of session she noted continued soreness but did report feeling better.   Per Evaluation: Patient is a 62 y.o. F who was seen today for physical therapy evaluation and treatment for dx of Spondylolisthesis, lumbar region. Tashawna has functional trunk mobility with reported pain noted at end range flexion and extension with pain located at the L SIJ. She demonstrates gross LE weakness with MMT. Special testing suggestion SIJ involvement with L innominate anterior deficit. She noted reduction in low back pain and improvement in trunk mobility with hamstring stretching/ hip flexor activation and STW for the R piriformis. She would benefit from physical therapy to decrease low back/ SIJ pain, improve LE strength, maximize gait/ lifting biomechanics and function by addressing the deficits listed.   OBJECTIVE IMPAIRMENTS: Abnormal gait, decreased balance, decreased endurance, decreased ROM, decreased strength, improper body mechanics, postural dysfunction, and pain.   ACTIVITY LIMITATIONS: carrying, lifting, standing, squatting, stairs, and locomotion level  PARTICIPATION LIMITATIONS: shopping, community activity, occupation, yard work, and school  PERSONAL FACTORS: Age, Past/current experiences,  Time since onset of injury/illness/exacerbation, and 1 comorbidity: hx of discectomies  are also affecting patient's functional outcome.   REHAB POTENTIAL: Good  CLINICAL DECISION MAKING: Evolving/moderate complexity  EVALUATION COMPLEXITY: Moderate   GOALS: Goals reviewed with patient? Yes  SHORT TERM GOALS: Target date: 04/10/2023  Pt to be IND with initial HEP to assist with therapeutic progression Baseline: Goal status: Met on 04/13/2023  2.  Pt to verbalize/ demo efficient posture and lifting mechanics to prevent and reduce low back pain Baseline:  Goal status: progressing 04/13/2023  3.  Pt to be able to sit , stand and walk for >/= 15 min with </= max of 6/10 pain  Baseline:  Goal status: progressing 04/13/2023  4.  Improve FOTO score to >/48% to demo improving function Baseline:  Goal status: not reassessed on 04/13/2023  LONG TERM GOALS: Target date: 05/08/2023  Pt to be increase gross LE strength to >/=4 to promote functional strength to assist with lifting/ carrying activities as related to her job requirements Baseline:  Goal status: INITIAL  2.  Pt to be able to stand and walk for >/=1 hour and perfrom all lifting activities related to her job described as min difficult  with </= 2/10 max pain  Baseline:  Goal status: INITIAL  3.  Pt to report no referred RLE symptoms for >/= 1 week for improvement in function and QOL. Baseline:  Goal status: INITIAL  4.  Pt to improve her FOTO score to >/= 59 To demonstrate improvement in function Baseline:  Goal status: INITIAL  5.  Pt to be IND with all HEP and is able to maintain and progress her current LOF IND.  Baseline:  Goal status:  INITIAL PLAN:  PT FREQUENCY: 1-2x/week  PT DURATION: 8 weeks  PLANNED INTERVENTIONS: Therapeutic exercises, Therapeutic activity, Neuromuscular re-education, Balance training, Gait training, Patient/Family education, Self Care, Joint mobilization, Joint manipulation, Stair training,  Aquatic Therapy, Dry Needling, Spinal manipulation, Spinal mobilization, Cryotherapy, Moist heat, Taping, Traction, Ionotophoresis 4mg /ml Dexamethasone, Manual therapy, and Re-evaluation.  PLAN FOR NEXT SESSION: Review / update HEP PRN. Measure LLD, stretch hamstring on L/ strengthening hip flexors on the L, STW for piriformis on the R, gross hip strengthening, stair training. STW on the R calf, calf stretching. Gait training. Reassess FOTO   Ardean Simonich PT, DPT, LAT, ATC  04/19/23  12:04 PM

## 2023-04-20 NOTE — Therapy (Signed)
OUTPATIENT PHYSICAL THERAPY TREATMENT   Patient Name: Kerri Gonzalez MRN: [id number] DOB:1961-03-21, 62 y.o., female Today's Date: 04/21/2023  END OF SESSION:  PT End of Session - 04/21/23 1019     Visit Number 7    Number of Visits 16    Date for PT Re-Evaluation 05/08/23    Authorization Type [employer]    Progress Note Due on Visit 10    PT Start Time 1015    PT Stop Time 1100    PT Time Calculation (min) 45 min    Activity Tolerance Patient tolerated treatment well;Patient limited by pain    Behavior During Therapy [facility] for tasks assessed/performed                   Past Medical History:  Diagnosis Date   Meniere disease    Ulcerative colitis (HCC)    Past Surgical History:  Procedure Laterality Date   LUMBAR DISC SURGERY  2010   LUMBAR DISC SURGERY  2013   Patient Active Problem List   Diagnosis Date Noted   Memory change 03/18/2016    PCP: Cleatis Polka., MD   REFERRING PROVIDER: Barnett Abu, MD   REFERRING DIAG: Spondylolisthesis, lumbar region [M43.16]   Rationale for Evaluation and Treatment: Rehabilitation  THERAPY DIAG:  Pain in right ankle and joints of right foot  Muscle weakness (generalized)  Other low back pain  ONSET DATE: 2010  SUBJECTIVE:                                                                                                                                                                                           SUBJECTIVE STATEMENT: Pt wants to focus on her Rt foot.  She feels weakness in her buttock to her Rt leg.  She reports doing Pilates 15 years with classical. The dry needling was so painful 2 visits ago.  Swelling. Foot feels better.    PERTINENT HISTORY:  Hx of multiple lumbar discectomies  PAIN:  Back Are you having pain? Yes: NPRS scale: 5/10 "this is her baseline"  Pain location: Low back /  Pain description: intenise, aggrivating Aggravating factors: lifting , walking, stairs Relieving factors:  stretching (cat cow), bird dog, heat  ankle Are you having pain? Yes: NPRS scale: 7/10 Pain location: R ankle, achilles Pain description: sore/ achilles Aggravating factors: walking/ standing steps.  Relieving factors: ice, rock tape  PRECAUTIONS: None  RED FLAGS: None   WEIGHT BEARING RESTRICTIONS: No  FALLS:  Has patient fallen in last 6 months? No  LIVING ENVIRONMENT: Lives with: lives with their family Lives in: House/apartment Stairs: Yes: Internal:  20 steps; on left going up Has following equipment at home: None  OCCUPATION: Research officer, trade union  PLOF: Independent  PATIENT GOALS: be pain free, be more active beyond pilates   OBJECTIVE:  Note: Objective measures were completed at Evaluation unless otherwise noted.  DIAGNOSTIC FINDINGS:  N/A  PATIENT SURVEYS:  FOTO 42% predicted 59% 04/19/2023 48%   SCREENING FOR RED FLAGS: Bowel or bladder incontinence: No Cauda equina syndrome: No   COGNITION: Overall cognitive status: Within functional limits for tasks assessed     SENSATION: Light touch: slight numbness in  R L1 compared bil   POSTURE: rounded shoulders and forward head  PALPATION: TTP along the L SIJ and R piriformis. Soreness noted along the R distal calf/ achilles into insertion.    L length assessment:  03/27/2023 True: R:81.2 cm L: 82.1 cm  Apparent: R: 89.9 L: 90.5  LUMBAR ROM:   AROM eval  Flexion 118 P! End range   Extension 15P  Right lateral flexion 18  Left lateral flexion 18 P!  Right rotation   Left rotation    (Blank rows = not tested)  NOTE: pain referred to the L SIJ   LOWER EXTREMITY ROM:     Active  Right eval Left eval  Hip flexion    Hip extension    Hip abduction    Hip adduction    Hip internal rotation    Hip external rotation    Knee flexion    Knee extension    Ankle dorsiflexion    Ankle plantarflexion    Ankle inversion    Ankle eversion     (Blank rows = not tested)  LOWER  EXTREMITY MMT:    MMT Right eval Left eval Right 04/19/23 Left 04/19/23  Hip flexion 4- 3+    Hip extension 4- 3+    Hip abduction 4- 3+    Hip adduction      Hip internal rotation      Hip external rotation      Knee flexion      Knee extension      Ankle dorsiflexion      Ankle plantarflexion      Ankle inversion      Ankle eversion       (Blank rows = not tested)  LUMBAR SPECIAL TESTS:  SI Compression/distraction test: Positive on the L and Gaenslen's test: Positive  FUNCTIONAL TESTS:  30 seconds chair stand test  GAIT: Distance walked: 110 ft to treatment room  Assistive device utilized: None Level of assistance: Complete Independence Comments: mild antalgic pattern with decreased toe off on the R and and step length on the L  TREATMENT:      [facility] Adult PT Treatment:                                                DATE: 04/21/23 Therapeutic Exercise: Reformer : 2 red 1 green, ball under pelvis  Arches, heels, toes Double and single leg Ball used between ankles to improve great toe extension Manual Therapy: Prone soft tissue work to bilateral gastrocsoleus Prone soft tissue work to right plantar fascia and surrounding heel deep tissue work with trigger point release  Self Care: Recommendations for modifications during Pilates class Recommendations for massage therapist  [facility] Adult PT Treatment:                                                DATE: 04/19/2023 Therapeutic Exercise: Calf stretch knee extending contract/ relax  Manual Therapy: MTRP along the posterior tib on the R, and medial soleus Talorcrural LAD grade IV oscillations Desensitization of the medial aspect of the ankle with towel   Therapeutic Activity: Reviewed FOTO assessment and discussed improvement in score and how it relates to her function and modifications to ADLs  while maintaining efficient form is a good continued to progress with activity  Self Care: Reviewed posture in the way of lifting related to using a golfers lift  Discussed Chronic pain and nerve adaptation and benefits of techniques of treatment/ exercises  [facility] Adult PT Treatment:                                                DATE: 04/13/2023 Therapeutic Exercise: Elliptical L1 x ramp L1x 5 min  Slant board calf stretch  knee ext/ flex 2 x 30 sec Bil heel raise on slant board 2 x 12 with ball squeeze for posterior tib actiation Toe yoga 1 x 10 holding 5 sec Towel scrunch x 2 bil LE  Updated HEP for toe yoga and towel scrunch Manual Therapy: IASTM along the quaratus planteae on the R foot Talocrural LAD grade V with cavitation noted.   Trigger Point Dry-Needling  Treatment instructions: Expect mild to moderate muscle soreness. S/S of pneumothorax if dry needled over a lung field, and to seek immediate medical attention should they occur. Patient verbalized understanding of these instructions and education.  Patient Consent Given: Yes Education handout provided: Yes Muscles treated: R quadratus plantae Electrical stimulation performed: No Parameters: N/A Treatment response/outcome: twitch response noted, reduction of tension  [facility] Adult PT Treatment:                                                DATE: 04/11/2023 Therapeutic Exercise: L ITB stretch 2 x30  Seated QL stretch on the L 1 x 30 sec Standing R foot rockerboard ankle inversion/ eversion 1 x 20 Standing rockerboard PF/DF 1 x 20 Manual Therapy: MTPR along the glute med/ min, piriformis, and QL on the R Tack and stretch of the L piriformis   PATIENT EDUCATION: 04/19/2023 Education details: discussed using a frozen water bottle and rolling on the floor on her foot to help with relief on the PF and ice for pain relief.  Person educated: Patient Education method: Explanation, Verbal cues, and Handouts Education  comprehension: verbalized understanding  HOME EXERCISE PROGRAM: Access Code: [email or web address] URL: [email or web address] Date: 04/13/2023 Prepared by: Lulu Riding  Exercises - Seated Hamstring Stretch (Mirrored)  - 1 x daily - 7 x weekly - 2 sets - 2 reps - 30 hold - SLR  - 1 x daily - 7 x weekly - 3 sets - 13 reps - 1 hold - Supine Piriformis Stretch  - 1 x daily - 7 x weekly - 2 sets - 2 reps - 30 -60 hold -  Sidelying Hip Abduction  - 1 x daily - 7 x weekly - 3 sets - 15 reps - Seated Calf Stretch with Strap  - 1 x daily - 7 x weekly - 2 sets - 10 reps - 30 -60 hold - Toe Yoga - Alternating Great Toe and Lesser Toe Extension  - 1 x daily - 7 x weekly - 2 sets - 10 reps - Towel Scrunches  - 1 x daily - 7 x weekly - 5 sets  ASSESSMENT:  CLINICAL IMPRESSION: Started patient on reformer doing foot work focusing on control and alignment of ankle and foot.  Patient had no pain as she transition from the reformer over to another treatment room.  Discussed footwear, massage therapy and other tools to relieve plantar fascia pain.  Patient did also have relief of pain after soft tissue work.  Discussed also the need to work on standing balance to improve ankle proprioception and foot intrinsics will plan to do this at the next visit.   Per Evaluation: Patient is a 62 y.o. F who was seen today for physical therapy evaluation and treatment for dx of Spondylolisthesis, lumbar region. Kerri Gonzalez has functional trunk mobility with reported pain noted at end range flexion and extension with pain located at the L SIJ. She demonstrates gross LE weakness with MMT. Special testing suggestion SIJ involvement with L innominate anterior deficit. She noted reduction in low back pain and improvement in trunk mobility with hamstring stretching/ hip flexor activation and STW for the R piriformis. She would benefit from physical therapy to decrease low back/ SIJ pain, improve LE strength, maximize gait/ lifting  biomechanics and function by addressing the deficits listed.   OBJECTIVE IMPAIRMENTS: Abnormal gait, decreased balance, decreased endurance, decreased ROM, decreased strength, improper body mechanics, postural dysfunction, and pain.   ACTIVITY LIMITATIONS: carrying, lifting, standing, squatting, stairs, and locomotion level  PARTICIPATION LIMITATIONS: shopping, community activity, occupation, yard work, and school  PERSONAL FACTORS: Age, Past/current experiences, Time since onset of injury/illness/exacerbation, and 1 comorbidity: hx of discectomies  are also affecting patient's functional outcome.   REHAB POTENTIAL: Good  CLINICAL DECISION MAKING: Evolving/moderate complexity  EVALUATION COMPLEXITY: Moderate   GOALS: Goals reviewed with patient? Yes  SHORT TERM GOALS: Target date: 04/10/2023  Pt to be IND with initial HEP to assist with therapeutic progression Baseline: Goal status: Met on 04/13/2023  2.  Pt to verbalize/ demo efficient posture and lifting mechanics to prevent and reduce low back pain Baseline:  Goal status: progressing 04/13/2023  3.  Pt to be able to sit , stand and walk for >/= 15 min with </= max of 6/10 pain  Baseline:  Goal status: progressing 04/13/2023  4.  Improve FOTO score to >/48% to demo improving function Baseline:  Goal status: not reassessed on 04/13/2023  LONG TERM GOALS: Target date: 05/08/2023  Pt to be increase gross LE strength to >/=4 to promote functional strength to assist with lifting/ carrying activities as related to her job requirements Baseline:  Goal status: INITIAL  2.  Pt to be able to stand and walk for >/=1 hour and perfrom all lifting activities related to her job described as min difficult  with </= 2/10 max pain  Baseline:  Goal status: INITIAL  3.  Pt to report no referred RLE symptoms for >/= 1 week for improvement in function and QOL. Baseline:  Goal status: INITIAL  4.  Pt to improve her FOTO score to >/= 59 To  demonstrate improvement in function  Baseline:  Goal status: INITIAL  5.  Pt to be IND with all HEP and is able to maintain and progress her current LOF IND.  Baseline:  Goal status: INITIAL PLAN:  PT FREQUENCY: 1-2x/week  PT DURATION: 8 weeks  PLANNED INTERVENTIONS: Therapeutic exercises, Therapeutic activity, Neuromuscular re-education, Balance training, Gait training, Patient/Family education, Self Care, Joint mobilization, Joint manipulation, Stair training, Aquatic Therapy, Dry Needling, Spinal manipulation, Spinal mobilization, Cryotherapy, Moist heat, Taping, Traction, Ionotophoresis 4mg /ml Dexamethasone, Manual therapy, and Re-evaluation.  PLAN FOR NEXT SESSION: Review / update HEP PRN. Measure LLD, stretch hamstring on L/ strengthening hip flexors on the L, STW for piriformis on the R, gross hip strengthening, stair training. STW on the R calf, calf stretching. Gait training. Reassess Kerri Gonzalez, PT 04/21/23 1:20 PM Phone: [phone] Fax: [phone]

## 2023-04-21 ENCOUNTER — Encounter: Payer: Self-pay | Admitting: Physical Therapy

## 2023-04-21 ENCOUNTER — Ambulatory Visit: Payer: Managed Care, Other (non HMO) | Admitting: Physical Therapy

## 2023-04-21 DIAGNOSIS — M6281 Muscle weakness (generalized): Secondary | ICD-10-CM

## 2023-04-21 DIAGNOSIS — M5459 Other low back pain: Secondary | ICD-10-CM

## 2023-04-21 DIAGNOSIS — M25571 Pain in right ankle and joints of right foot: Secondary | ICD-10-CM | POA: Diagnosis not present

## 2023-04-24 ENCOUNTER — Ambulatory Visit: Payer: Managed Care, Other (non HMO) | Admitting: Physical Therapy

## 2023-04-24 ENCOUNTER — Encounter: Payer: Self-pay | Admitting: Physical Therapy

## 2023-04-24 DIAGNOSIS — M25571 Pain in right ankle and joints of right foot: Secondary | ICD-10-CM

## 2023-04-24 DIAGNOSIS — M5459 Other low back pain: Secondary | ICD-10-CM

## 2023-04-24 DIAGNOSIS — M6281 Muscle weakness (generalized): Secondary | ICD-10-CM

## 2023-04-24 NOTE — Therapy (Signed)
OUTPATIENT PHYSICAL THERAPY TREATMENT   Patient Name: Kerri Gonzalez MRN: [id number] DOB:1961/06/06, 62 y.o., female Today's Date: 04/24/2023  END OF SESSION:  PT End of Session - 04/24/23 1338     Visit Number 8    Number of Visits 16    Date for PT Re-Evaluation 05/08/23    Authorization Type [employer]    PT Start Time 1335    PT Stop Time 1415    PT Time Calculation (min) 40 min    Activity Tolerance Patient tolerated treatment well;Patient limited by pain    Behavior During Therapy [facility] for tasks assessed/performed                   Past Medical History:  Diagnosis Date   Meniere disease    Ulcerative colitis (HCC)    Past Surgical History:  Procedure Laterality Date   LUMBAR DISC SURGERY  2010   LUMBAR DISC SURGERY  2013   Patient Active Problem List   Diagnosis Date Noted   Memory change 03/18/2016    PCP: Cleatis Polka., MD   REFERRING PROVIDER: Barnett Abu, MD   REFERRING DIAG: Spondylolisthesis, lumbar region [M43.16]   Rationale for Evaluation and Treatment: Rehabilitation  THERAPY DIAG:  Pain in right ankle and joints of right foot  Other low back pain  Muscle weakness (generalized)  ONSET DATE: 2010  SUBJECTIVE:                                                                                                                                                                                           SUBJECTIVE STATEMENT: " I am feeling more soreness in the R side  PERTINENT HISTORY:  Hx of multiple lumbar discectomies  PAIN:  Back Are you having pain? Yes: NPRS scale: 8-9/10 "this is her baseline"  Pain location: Low back /  Pain description: intenise, aggrivating Aggravating factors: lifting , walking, stairs Relieving factors: stretching (cat cow), bird dog, heat  ankle Are you having pain? Yes: NPRS scale: 7/10 Pain location: R ankle, achilles Pain description: sore/ achilles Aggravating factors: walking/ standing steps.   Relieving factors: ice, rock tape  PRECAUTIONS: None  RED FLAGS: None   WEIGHT BEARING RESTRICTIONS: No  FALLS:  Has patient fallen in last 6 months? No  LIVING ENVIRONMENT: Lives with: lives with their family Lives in: House/apartment Stairs: Yes: Internal: 20 steps; on left going up Has following equipment at home: None  OCCUPATION: Research officer, trade union  PLOF: Independent  PATIENT GOALS: be pain free, be more active beyond pilates   OBJECTIVE:  Note: Objective measures were completed at Evaluation unless otherwise  noted.  DIAGNOSTIC FINDINGS:  N/A  PATIENT SURVEYS:  FOTO 42% predicted 59% 04/19/2023 48%   SCREENING FOR RED FLAGS: Bowel or bladder incontinence: No Cauda equina syndrome: No   COGNITION: Overall cognitive status: Within functional limits for tasks assessed     SENSATION: Light touch: slight numbness in  R L1 compared bil   POSTURE: rounded shoulders and forward head  PALPATION: TTP along the L SIJ and R piriformis. Soreness noted along the R distal calf/ achilles into insertion.    L length assessment:  03/27/2023 True: R:81.2 cm L: 82.1 cm  Apparent: R: 89.9 L: 90.5  LUMBAR ROM:   AROM eval  Flexion 118 P! End range   Extension 15P  Right lateral flexion 18  Left lateral flexion 18 P!  Right rotation   Left rotation    (Blank rows = not tested)  NOTE: pain referred to the L SIJ   LOWER EXTREMITY ROM:     Active  Right eval Left eval  Hip flexion    Hip extension    Hip abduction    Hip adduction    Hip internal rotation    Hip external rotation    Knee flexion    Knee extension    Ankle dorsiflexion    Ankle plantarflexion    Ankle inversion    Ankle eversion     (Blank rows = not tested)  LOWER EXTREMITY MMT:    MMT Right eval Left eval Right 04/19/23 Left 04/19/23  Hip flexion 4- 3+    Hip extension 4- 3+    Hip abduction 4- 3+    Hip adduction      Hip internal rotation      Hip external  rotation      Knee flexion      Knee extension      Ankle dorsiflexion      Ankle plantarflexion      Ankle inversion      Ankle eversion       (Blank rows = not tested)  LUMBAR SPECIAL TESTS:  SI Compression/distraction test: Positive on the L and Gaenslen's test: Positive  FUNCTIONAL TESTS:  30 seconds chair stand test  GAIT: Distance walked: 110 ft to treatment room  Assistive device utilized: None Level of assistance: Complete Independence Comments: mild antalgic pattern with decreased toe off on the R and and step length on the L  TREATMENT:     [facility] Adult PT Treatment:                                                DATE: 04/24/2023 Therapeutic Exercise: Sidelying R hip isometric adduction 1 x 5 holding 10 seconds, 2nd set performed concordant with manual  QL stretching 2 x 30 sec L sidelying R glute med stretch 2 x 30 sec 1 x soleus/ gastroc stretch  Manual Therapy: MTPR along the R glute med, R solesu IASTM along the Medial calcaneal tubercle Self Care: Reviwed importance of walking and normal as possible avoding compensation as well as over corrections to prevent issues.    [facility] Adult PT Treatment:  DATE: 04/21/23 Therapeutic Exercise: Reformer : 2 red 1 green, ball under pelvis  Arches, heels, toes Double and single leg Ball used between ankles to improve great toe extension Manual Therapy: Prone soft tissue work to bilateral gastrocsoleus Prone soft tissue work to right plantar fascia and surrounding heel deep tissue work with trigger point release  Self Care: Recommendations for modifications during Pilates class Recommendations for massage therapist                                                                                                                             [facility] Adult PT Treatment:                                                DATE: 04/19/2023 Therapeutic Exercise: Calf stretch knee extending  contract/ relax  Manual Therapy: MTRP along the posterior tib on the R, and medial soleus Talorcrural LAD grade IV oscillations Desensitization of the medial aspect of the ankle with towel   Therapeutic Activity: Reviewed FOTO assessment and discussed improvement in score and how it relates to her function and modifications to ADLs while maintaining efficient form is a good continued to progress with activity  Self Care: Reviewed posture in the way of lifting related to using a golfers lift  Discussed Chronic pain and nerve adaptation and benefits of techniques of treatment/ exercises   PATIENT EDUCATION: 04/19/2023 Education details: discussed using a frozen water bottle and rolling on the floor on her foot to help with relief on the PF and ice for pain relief.  Person educated: Patient Education method: Explanation, Verbal cues, and Handouts Education comprehension: verbalized understanding  HOME EXERCISE PROGRAM: Access Code: [email or web address] URL: [email or web address] Date: 04/13/2023 Prepared by: Lulu Riding  Exercises - Seated Hamstring Stretch (Mirrored)  - 1 x daily - 7 x weekly - 2 sets - 2 reps - 30 hold - SLR  - 1 x daily - 7 x weekly - 3 sets - 13 reps - 1 hold - Supine Piriformis Stretch  - 1 x daily - 7 x weekly - 2 sets - 2 reps - 30 -60 hold - Sidelying Hip Abduction  - 1 x daily - 7 x weekly - 3 sets - 15 reps - Seated Calf Stretch with Strap  - 1 x daily - 7 x weekly - 2 sets - 10 reps - 30 -60 hold - Toe Yoga - Alternating Great Toe and Lesser Toe Extension  - 1 x daily - 7 x weekly - 2 sets - 10 reps - Towel Scrunches  - 1 x daily - 7 x weekly - 5 sets  ASSESSMENT:  CLINICAL IMPRESSION: Mrs Coogle arrives to PT today noting her entire R LE is having pain and giving way. Continued STW along the glute med and lumbar paraspinals followed with stretching.  Worked on adductor activation in l sidelying which pt noted relief of R proximal LE pain but  noted heel pain was still. Worked on heel with calf stretching and STW which she noted relief. Discussed walking as normal as possible to prevent compensation or issues.   Per Evaluation: Patient is a 62 y.o. F who was seen today for physical therapy evaluation and treatment for dx of Spondylolisthesis, lumbar region. Daryn has functional trunk mobility with reported pain noted at end range flexion and extension with pain located at the L SIJ. She demonstrates gross LE weakness with MMT. Special testing suggestion SIJ involvement with L innominate anterior deficit. She noted reduction in low back pain and improvement in trunk mobility with hamstring stretching/ hip flexor activation and STW for the R piriformis. She would benefit from physical therapy to decrease low back/ SIJ pain, improve LE strength, maximize gait/ lifting biomechanics and function by addressing the deficits listed.   OBJECTIVE IMPAIRMENTS: Abnormal gait, decreased balance, decreased endurance, decreased ROM, decreased strength, improper body mechanics, postural dysfunction, and pain.   ACTIVITY LIMITATIONS: carrying, lifting, standing, squatting, stairs, and locomotion level  PARTICIPATION LIMITATIONS: shopping, community activity, occupation, yard work, and school  PERSONAL FACTORS: Age, Past/current experiences, Time since onset of injury/illness/exacerbation, and 1 comorbidity: hx of discectomies  are also affecting patient's functional outcome.   REHAB POTENTIAL: Good  CLINICAL DECISION MAKING: Evolving/moderate complexity  EVALUATION COMPLEXITY: Moderate   GOALS: Goals reviewed with patient? Yes  SHORT TERM GOALS: Target date: 04/10/2023  Pt to be IND with initial HEP to assist with therapeutic progression Baseline: Goal status: Met on 04/13/2023  2.  Pt to verbalize/ demo efficient posture and lifting mechanics to prevent and reduce low back pain Baseline:  Goal status: progressing 04/13/2023  3.  Pt to be able  to sit , stand and walk for >/= 15 min with </= max of 6/10 pain  Baseline:  Goal status: progressing 04/13/2023  4.  Improve FOTO score to >/48% to demo improving function Baseline:  Goal status: not reassessed on 04/13/2023  LONG TERM GOALS: Target date: 05/08/2023  Pt to be increase gross LE strength to >/=4 to promote functional strength to assist with lifting/ carrying activities as related to her job requirements Baseline:  Goal status: INITIAL  2.  Pt to be able to stand and walk for >/=1 hour and perfrom all lifting activities related to her job described as min difficult  with </= 2/10 max pain  Baseline:  Goal status: INITIAL  3.  Pt to report no referred RLE symptoms for >/= 1 week for improvement in function and QOL. Baseline:  Goal status: INITIAL  4.  Pt to improve her FOTO score to >/= 59 To demonstrate improvement in function Baseline:  Goal status: INITIAL  5.  Pt to be IND with all HEP and is able to maintain and progress her current LOF IND.  Baseline:  Goal status: INITIAL PLAN:  PT FREQUENCY: 1-2x/week  PT DURATION: 8 weeks  PLANNED INTERVENTIONS: Therapeutic exercises, Therapeutic activity, Neuromuscular re-education, Balance training, Gait training, Patient/Family education, Self Care, Joint mobilization, Joint manipulation, Stair training, Aquatic Therapy, Dry Needling, Spinal manipulation, Spinal mobilization, Cryotherapy, Moist heat, Taping, Traction, Ionotophoresis 4mg /ml Dexamethasone, Manual therapy, and Re-evaluation.  PLAN FOR NEXT SESSION: Review / update HEP PRN. Measure LLD, stretch hamstring on L/ strengthening hip flexors on the L, STW for piriformis on the R, gross hip strengthening, stair training. STW on the R calf, calf stretching. Gait training.  Melvia Matousek PT, DPT, LAT, ATC  04/24/23  2:47 PM

## 2023-04-25 ENCOUNTER — Ambulatory Visit: Payer: Managed Care, Other (non HMO) | Admitting: Physical Therapy

## 2023-04-25 ENCOUNTER — Encounter: Payer: Self-pay | Admitting: Physical Therapy

## 2023-04-25 DIAGNOSIS — M25571 Pain in right ankle and joints of right foot: Secondary | ICD-10-CM

## 2023-04-25 DIAGNOSIS — M6281 Muscle weakness (generalized): Secondary | ICD-10-CM

## 2023-04-25 DIAGNOSIS — M5459 Other low back pain: Secondary | ICD-10-CM

## 2023-04-25 NOTE — Therapy (Signed)
OUTPATIENT PHYSICAL THERAPY TREATMENT   Patient Name: Kerri Gonzalez MRN: [id number] DOB:1960/12/14, 62 y.o., female Today's Date: 04/25/2023  END OF SESSION:  PT End of Session - 04/25/23 1021     Visit Number 9    Number of Visits 16    Date for PT Re-Evaluation 05/08/23    Authorization Type [employer]    Progress Note Due on Visit 10    PT Start Time 1019    PT Stop Time 1100    PT Time Calculation (min) 41 min    Activity Tolerance Patient tolerated treatment well;Patient limited by pain    Behavior During Therapy [facility] for tasks assessed/performed                    Past Medical History:  Diagnosis Date   Meniere disease    Ulcerative colitis (HCC)    Past Surgical History:  Procedure Laterality Date   LUMBAR DISC SURGERY  2010   LUMBAR DISC SURGERY  2013   Patient Active Problem List   Diagnosis Date Noted   Memory change 03/18/2016    PCP: Cleatis Polka., MD   REFERRING PROVIDER: Barnett Abu, MD   REFERRING DIAG: Spondylolisthesis, lumbar region [M43.16]   Rationale for Evaluation and Treatment: Rehabilitation  THERAPY DIAG:  Pain in right ankle and joints of right foot  Other low back pain  Muscle weakness (generalized)  ONSET DATE: 2010  SUBJECTIVE:                                                                                                                                                                                           SUBJECTIVE STATEMENT: I feel better than I did yesterday.  Patient reports right lower extremity giving out at times.  She reports pain with landing on the medial aspect of her heel so she tends to roll out/supinate.  She did not hour plus Pilates session yesterday without any problems.  She is able to modify easily for ankle and heel contact with the equipment.  PERTINENT HISTORY:  Hx of multiple lumbar discectomies  PAIN:  Back Are you having pain? Yes: NPRS scale: Not rated today 11/19  pain  location: Low back /  Pain description: intenise, aggrivating Aggravating factors: lifting , walking, stairs Relieving factors: stretching (cat cow), bird dog, heat  ankle Are you having pain? Yes: NPRS scale: 7/10 Not rated today 11/19 Pain location: R ankle, achilles Pain description: sore/ achilles Aggravating factors: walking/ standing steps.  Relieving factors: ice, rock tape  PRECAUTIONS: None  RED FLAGS: None   WEIGHT BEARING RESTRICTIONS: No  FALLS:  Has patient fallen in last 6 months? No  LIVING ENVIRONMENT: Lives with: lives with their family Lives in: House/apartment Stairs: Yes: Internal: 20 steps; on left going up Has following equipment at home: None  OCCUPATION: Research officer, trade [employer]  PLOF: Independent  PATIENT GOALS: be pain free, be more active beyond pilates   OBJECTIVE:  Note: Objective measures were completed at Evaluation unless otherwise noted.  DIAGNOSTIC FINDINGS:  N/A  PATIENT SURVEYS:  FOTO 42% predicted 59% 04/19/2023 48%   SCREENING FOR RED FLAGS: Bowel or bladder incontinence: No Cauda equina syndrome: No   COGNITION: Overall cognitive status: Within functional limits for tasks assessed     SENSATION: Light touch: slight numbness in  R L1 compared bil   POSTURE: rounded shoulders and forward head  PALPATION: TTP along the L SIJ and R piriformis. Soreness noted along the R distal calf/ achilles into insertion.    L length assessment:  03/27/2023 True: R:81.2 cm L: 82.1 cm  Apparent: R: 89.9 L: 90.5  LUMBAR ROM:   AROM eval  Flexion 118 P! End range   Extension 15P  Right lateral flexion 18  Left lateral flexion 18 P!  Right rotation   Left rotation    (Blank rows = not tested)  NOTE: pain referred to the L SIJ   LOWER EXTREMITY ROM:     Active  Right eval Left eval  Hip flexion    Hip extension    Hip abduction    Hip adduction    Hip internal rotation    Hip external rotation    Knee flexion     Knee extension    Ankle dorsiflexion    Ankle plantarflexion    Ankle inversion    Ankle eversion     (Blank rows = not tested)  LOWER EXTREMITY MMT:    MMT Right eval Left eval Right 04/25/23 Left 04/25/23  Hip flexion 4- 3+ 4+ 4+  Hip extension 4- 3+ 4 4  Hip abduction 4- 3+ 4- 4  Hip adduction      Hip internal rotation      Hip external rotation      Knee flexion   5 5  Knee extension   5 5  Ankle dorsiflexion   5 5  Ankle plantarflexion      Ankle inversion      Ankle eversion       (Blank rows = not tested)  LUMBAR SPECIAL TESTS:  SI Compression/distraction test: Positive on the L and Gaenslen's test: Positive  FUNCTIONAL TESTS:  30 seconds chair stand test  GAIT: Distance walked: 110 ft to treatment room  Assistive device utilized: None Level of assistance: Complete Independence Comments: mild antalgic pattern with decreased toe off on the R and and step length on the L  TREATMENT:      [facility] Adult PT Treatment:                                                DATE: 04/25/23 Therapeutic Exercise: Hip abduction  x 15  Blue band clam x 15   Forearm hip extension 2 sets  x 10 knee bent and then ext.  Bird dog hold x 5 each 5 sec Rt leg weaker and less stable when in contact with the mat  Sit to stand with band on thighs Hip hinge on foam  pad x 10 with light touch to countertop  Figure 4 x 2-3  Single leg balance on foam  Tandem stance on foam pad Narrow stance EO, EC Arms overhead with head turns, EO Manual Therapy: NA today     [facility] Adult PT Treatment:                                                DATE: 04/24/2023 Therapeutic Exercise: Sidelying R hip isometric adduction 1 x 5 holding 10 seconds, 2nd set performed concordant with manual  QL stretching 2 x 30 sec L sidelying R glute med stretch 2 x 30 sec 1 x soleus/ gastroc stretch  Manual Therapy: MTPR along the R glute med, R solesu IASTM along the Medial calcaneal tubercle Self  Care: Reviwed importance of walking and normal as possible avoding compensation as well as over corrections to prevent issues.    [facility] Adult PT Treatment:                                                DATE: 04/21/23 Therapeutic Exercise: Reformer : 2 red 1 green, ball under pelvis  Arches, heels, toes Double and single leg Ball used between ankles to improve great toe extension Manual Therapy: Prone soft tissue work to bilateral gastrocsoleus Prone soft tissue work to right plantar fascia and surrounding heel deep tissue work with trigger point release  Self Care: Recommendations for modifications during Pilates class Recommendations for massage therapist                                                                                                                             [facility] Adult PT Treatment:                                                DATE: 04/19/2023 Therapeutic Exercise: Calf stretch knee extending contract/ relax  Manual Therapy: MTRP along the posterior tib on the R, and medial soleus Talorcrural LAD grade IV oscillations Desensitization of the medial aspect of the ankle with towel   Therapeutic Activity: Reviewed FOTO assessment and discussed improvement in score and how it relates to her function and modifications to ADLs while maintaining efficient form is a good continued to progress with activity  Self Care: Reviewed posture in the way of lifting related to using a golfers lift  Discussed Chronic pain and nerve adaptation and benefits of techniques of treatment/ exercises   PATIENT EDUCATION: 04/19/2023 Education details: discussed using a frozen water bottle and rolling on the floor on her foot to  help with relief on the PF and ice for pain relief.  Person educated: Patient Education method: Explanation, Verbal cues, and Handouts Education comprehension: verbalized understanding  HOME EXERCISE PROGRAM: Access Code: [email or web address] URL:  [email or web address] Date: 04/13/2023 Prepared by: Lulu Riding  Exercises - Seated Hamstring Stretch (Mirrored)  - 1 x daily - 7 x weekly - 2 sets - 2 reps - 30 hold - SLR  - 1 x daily - 7 x weekly - 3 sets - 13 reps - 1 hold - Supine Piriformis Stretch  - 1 x daily - 7 x weekly - 2 sets - 2 reps - 30 -60 hold - Sidelying Hip Abduction  - 1 x daily - 7 x weekly - 3 sets - 15 reps - Seated Calf Stretch with Strap  - 1 x daily - 7 x weekly - 2 sets - 10 reps - 30 -60 hold - Toe Yoga - Alternating Great Toe and Lesser Toe Extension  - 1 x daily - 7 x weekly - 2 sets - 10 reps - Towel Scrunches  - 1 x daily - 7 x weekly - 5 sets  ASSESSMENT:  CLINICAL IMPRESSION: Patient reports good relief after yesterday's session.  Today we focused on glute strength, posterior and lateral.  Overall she has good strength but noted some right lateral hip weakness both in isolated testing and in functional exercises.  Exercises did increase pain in the L5 area on the right side.  The exercises were difficult for her but it did bring to light the connection of her foot and ankle to her hip.  I added a couple of exercises to her program to target the problem areas.  Overall she is a bit stronger in her hips than when she began but does continue to have compensatory patterns starting in her ankle.  She will continue to benefit from skilled physical therapy.  She was still sore from dry needling session 3 weeks ago in her foot.   Per Evaluation: Patient is a 62 y.o. F who was seen today for physical therapy evaluation and treatment for dx of Spondylolisthesis, lumbar region. Jakevia has functional trunk mobility with reported pain noted at end range flexion and extension with pain located at the L SIJ. She demonstrates gross LE weakness with MMT. Special testing suggestion SIJ involvement with L innominate anterior deficit. She noted reduction in low back pain and improvement in trunk mobility with  hamstring stretching/ hip flexor activation and STW for the R piriformis. She would benefit from physical therapy to decrease low back/ SIJ pain, improve LE strength, maximize gait/ lifting biomechanics and function by addressing the deficits listed.   OBJECTIVE IMPAIRMENTS: Abnormal gait, decreased balance, decreased endurance, decreased ROM, decreased strength, improper body mechanics, postural dysfunction, and pain.   ACTIVITY LIMITATIONS: carrying, lifting, standing, squatting, stairs, and locomotion level  PARTICIPATION LIMITATIONS: shopping, community activity, occupation, yard work, and school  PERSONAL FACTORS: Age, Past/current experiences, Time since onset of injury/illness/exacerbation, and 1 comorbidity: hx of discectomies  are also affecting patient's functional outcome.   REHAB POTENTIAL: Good  CLINICAL DECISION MAKING: Evolving/moderate complexity  EVALUATION COMPLEXITY: Moderate   GOALS: Goals reviewed with patient? Yes  SHORT TERM GOALS: Target date: 04/10/2023  Pt to be IND with initial HEP to assist with therapeutic progression Baseline: Goal status: Met on 04/13/2023  2.  Pt to verbalize/ demo efficient posture and lifting mechanics to prevent and reduce low back pain Baseline:  Goal status: progressing 04/13/2023  3.  Pt to be able to sit , stand and walk for >/= 15 min with </= max of 6/10 pain  Baseline:  Goal status: progressing 04/13/2023  4.  Improve FOTO score to >/48% to demo improving function Baseline:  Goal status: not reassessed on 04/13/2023  LONG TERM GOALS: Target date: 05/08/2023  Pt to be increase gross LE strength to >/=4 to promote functional strength to assist with lifting/ carrying activities as related to her job requirements Baseline:  Goal status: see above, ongoing   2.  Pt to be able to stand and walk for >/=1 hour and perfrom all lifting activities related to her job described as min difficult  with </= 2/10 max pain  Baseline:   Goal status: ongoing   3.  Pt to report no referred RLE symptoms for >/= 1 week for improvement in function and QOL. Baseline:  Goal status: ongoing   4.  Pt to improve her FOTO score to >/= 59 To demonstrate improvement in function Baseline:  Goal status: ongoing   5.  Pt to be IND with all HEP and is able to maintain and progress her current LOF IND.  Baseline:  Goal status: ongoing  PLAN:  PT FREQUENCY: 1-2x/week  PT DURATION: 8 weeks  PLANNED INTERVENTIONS: Therapeutic exercises, Therapeutic activity, Neuromuscular re-education, Balance training, Gait training, Patient/Family education, Self Care, Joint mobilization, Joint manipulation, Stair training, Aquatic Therapy, Dry Needling, Spinal manipulation, Spinal mobilization, Cryotherapy, Moist heat, Taping, Traction, Ionotophoresis 4mg /ml Dexamethasone, Manual therapy, and Re-evaluation.  PLAN FOR NEXT SESSION: Progress note, check goals   review / update HEP PRN. Measure LLD, stretch hamstring on L/ strengthening hip flexors on the L, STW for piriformis on the R, gross hip strengthening, stair training. STW on the R calf, calf stretching. Gait training.  Karie Mainland, PT 04/25/23 2:42 PM Phone: [phone] Fax: [phone]

## 2023-04-26 ENCOUNTER — Encounter: Payer: Managed Care, Other (non HMO) | Admitting: Physical Therapy

## 2023-04-28 ENCOUNTER — Emergency Department (HOSPITAL_COMMUNITY)
Admission: EM | Admit: 2023-04-28 | Discharge: 2023-04-28 | Disposition: A | Discharge status: Home / Self Care | Payer: Managed Care, Other (non HMO) | Attending: Emergency Medicine | Admitting: Emergency Medicine

## 2023-04-28 ENCOUNTER — Other Ambulatory Visit: Payer: Self-pay

## 2023-04-28 ENCOUNTER — Encounter (HOSPITAL_COMMUNITY): Payer: Self-pay | Admitting: *Deleted

## 2023-04-28 ENCOUNTER — Ambulatory Visit (HOSPITAL_COMMUNITY)
Admission: EM | Admit: 2023-04-28 | Discharge: 2023-04-28 | Disposition: A | Discharge status: Home / Self Care | Payer: Managed Care, Other (non HMO)

## 2023-04-28 ENCOUNTER — Emergency Department (HOSPITAL_COMMUNITY): Payer: Managed Care, Other (non HMO)

## 2023-04-28 ENCOUNTER — Encounter (HOSPITAL_COMMUNITY): Payer: Self-pay

## 2023-04-28 DIAGNOSIS — R519 Headache, unspecified: Secondary | ICD-10-CM | POA: Diagnosis present

## 2023-04-28 DIAGNOSIS — Z794 Long term (current) use of insulin: Secondary | ICD-10-CM | POA: Insufficient documentation

## 2023-04-28 LAB — BASIC METABOLIC PANEL
Anion gap: 10 (ref 5–15)
BUN: 19 mg/dL (ref 8–23)
CO2: 25 mmol/L (ref 22–32)
Calcium: 9.5 mg/dL (ref 8.9–10.3)
Chloride: 102 mmol/L (ref 98–111)
Creatinine, Ser: 0.94 mg/dL (ref 0.44–1.00)
GFR, Estimated: >60 mL/min (ref >60)
Glucose, Bld: 87 mg/dL (ref 70–99)
Potassium: 3.7 mmol/L (ref 3.5–5.1)
Sodium: 137 mmol/L (ref 135–145)

## 2023-04-28 LAB — CBC WITH DIFFERENTIAL/PLATELET
Abs Immature Granulocytes: 0.02 10*3/uL (ref 0.00–0.07)
Basophils Absolute: 0 10*3/uL (ref 0.0–0.1)
Basophils Relative: 1 %
Eosinophils Absolute: 0.1 10*3/uL (ref 0.0–0.5)
Eosinophils Relative: 2 %
HCT: 38.8 % (ref 36.0–46.0)
Hemoglobin: 13.5 g/dL (ref 12.0–15.0)
Immature Granulocytes: 0 %
Lymphocytes Relative: 36 %
Lymphs Abs: 2.2 10*3/uL (ref 0.7–4.0)
MCH: 33.8 pg (ref 26.0–34.0)
MCHC: 34.8 g/dL (ref 30.0–36.0)
MCV: 97.2 fL (ref 80.0–100.0)
Monocytes Absolute: 0.5 10*3/uL (ref 0.1–1.0)
Monocytes Relative: 8 %
Neutro Abs: 3.3 10*3/uL (ref 1.7–7.7)
Neutrophils Relative %: 53 %
Platelets: 261 10*3/uL (ref 150–400)
RBC: 3.99 MIL/uL (ref 3.87–5.11)
RDW: 12 % (ref 11.5–15.5)
WBC: 6.1 10*3/uL (ref 4.0–10.5)
nRBC: 0 % (ref 0.0–0.2)

## 2023-04-28 LAB — C-REACTIVE PROTEIN: CRP: 0.5 mg/dL (ref <1.0)

## 2023-04-28 LAB — SEDIMENTATION RATE: Sed Rate: 2 mm/h (ref 0–22)

## 2023-04-28 MED ORDER — ONDANSETRON HCL 4 MG/2ML IJ SOLN
4.0000 mg | Freq: Once | INTRAMUSCULAR | Status: AC
  Administered 2023-04-28: 4 mg via INTRAVENOUS
  Filled 2023-04-28: qty 2

## 2023-04-28 MED ORDER — HYDROCODONE-ACETAMINOPHEN 5-325 MG PO TABS: 1.0000 | ORAL_TABLET | Freq: Four times a day (QID) | ORAL | 0 refills | Status: DC | PRN

## 2023-04-28 MED ORDER — HYDROCODONE-ACETAMINOPHEN 5-325 MG PO TABS: 1.0000 | ORAL_TABLET | Freq: Four times a day (QID) | ORAL | 0 refills | Status: AC | PRN

## 2023-04-28 MED ORDER — CARBAMAZEPINE 200 MG PO TABS
200.0000 mg | ORAL_TABLET | Freq: Four times a day (QID) | ORAL | 0 refills | Status: AC
Start: 2023-04-28 — End: 2023-05-28

## 2023-04-28 MED ORDER — CARBAMAZEPINE 200 MG PO TABS
200.0000 mg | ORAL_TABLET | Freq: Four times a day (QID) | ORAL | 0 refills | Status: DC
Start: 2023-04-28 — End: 2023-04-28

## 2023-04-28 MED ORDER — DIPHENHYDRAMINE HCL 50 MG/ML IJ SOLN
50.0000 mg | Freq: Once | INTRAMUSCULAR | Status: AC
  Administered 2023-04-28: 50 mg via INTRAVENOUS
  Filled 2023-04-28: qty 1

## 2023-04-28 MED ORDER — CARBAMAZEPINE 200 MG PO TABS
200.0000 mg | ORAL_TABLET | Freq: Once | ORAL | Status: AC
  Administered 2023-04-28: 200 mg via ORAL
  Filled 2023-04-28: qty 1

## 2023-04-28 MED ORDER — LORAZEPAM 2 MG/ML IJ SOLN
0.5000 mg | Freq: Once | INTRAMUSCULAR | Status: AC
  Administered 2023-04-28: 0.5 mg via INTRAVENOUS
  Filled 2023-04-28: qty 1

## 2023-04-28 MED ORDER — MORPHINE SULFATE (PF) 4 MG/ML IV SOLN
4.0000 mg | Freq: Once | INTRAVENOUS | Status: AC
  Administered 2023-04-28: 4 mg via INTRAVENOUS
  Filled 2023-04-28: qty 1

## 2023-04-28 NOTE — ED Triage Notes (Signed)
Pt presenting today complaining of a intermittent pain that starts on her left temple and radiates to her ear, and down her jaw. Denies any nausea/vomiting.

## 2023-04-28 NOTE — ED Provider Triage Note (Signed)
Emergency Medicine Provider Triage Evaluation Note  Kerri Gonzalez , a 62 y.o. female  was evaluated in triage.  Pt complains of headache, started yesterday.  Began radiating to her right ear, temporal region into her jaw.  No fever, neck pain.  Did note when she got her blood pressure checked earlier today she had some tingling to her arm.  No weakness.  No vision changes, facial droop.  Review of Systems  Positive: HA, facial pain Negative:   Physical Exam  BP (!) 181/112   Pulse 78   Temp 98.2 F (36.8 C) (Oral)   Resp 18   Ht 5\' 2"  (1.575 m)   Wt 65.8 kg   SpO2 100%   BMI 26.52 kg/m  Gen:   Awake, no distress   Resp:  Normal effort  Ear:  Difficulty exam, no large lesions MSK:   Moves extremities without difficulty  Other:    Medical Decision Making  Medically screening exam initiated at 6:06 PM.  Appropriate orders placed.  Kerri Gonzalez was informed that the remainder of the evaluation will be completed by another provider, this initial triage assessment does not replace that evaluation, and the importance of remaining in the ED until their evaluation is complete.  HA   Jessicca Stitzer A, PA-C 04/28/23 1808

## 2023-04-28 NOTE — ED Provider Notes (Addendum)
[facility]    CSN: [id number] Arrival date & time: 04/28/23  1428      History   Chief Complaint Chief Complaint  Patient presents with   Otalgia    HPI Kerri Gonzalez is a 62 y.o. female.   Patient complains of severe right-sided facial pain that began yesterday.  Patient reports pain began in the temporal area yesterday.  Patient complains of pain shooting into her ear and nail to the side of her jaw.  Patient reports pain is severe.  Patient reports a shocking pain. Pt reports she called her provider and was told to come in to have her ear checked.  Pt reports having a headache as well.  Pt reports it helps to hold the side of her face.    The history is provided by the patient. No language interpreter was used.  Headache Pain location:  R temporal Quality:  Sharp and stabbing Radiates to:  Face Severity currently:  10/10 Severity at highest:  10/10 Onset quality:  Sudden Duration:  1 day Timing:  Constant Progression:  Worsening Chronicity:  New Similar to prior headaches: no   Context: not activity   Relieved by:  Nothing Worsened by:  Nothing Associated symptoms: facial pain     Past Medical History:  Diagnosis Date   Meniere disease    Ulcerative colitis [facility])     Patient Active Problem List   Diagnosis Date Noted   Memory change 03/18/2016    Past Surgical History:  Procedure Laterality Date   LUMBAR DISC SURGERY  2010   LUMBAR DISC SURGERY  2013    OB History   No obstetric history on file.      Home Medications    Prior to Admission medications   Medication Sig Start Date End Date Taking? Authorizing Provider  Cholecalciferol (VITAMIN D3) 5000 units CAPS Take 1 capsule by mouth daily.    [provider]  co-enzyme Q-10 30 MG capsule Take 30 mg by mouth daily.    [provider]  Cyanocobalamin (B12 LIQUID HEALTH BOOSTER PO) Take 1 mL by mouth daily.    [provider]  diclofenac Sodium (VOLTAREN)  1 % GEL Apply 2 g topically 4 (four) times daily. 03/02/23     estradiol (EVAMIST) 1.53 MG/SPRAY transdermal spray Place 1 spray to the inner arm once daily 10/25/22   Marcelle Overlie, MD  estradiol (EVAMIST) 1.53 MG/SPRAY transdermal spray USE AS DIRECTED 12/01/22     fluorometholone (FML) 0.1 % ophthalmic suspension Place 1 drop into the left eye 2 (two) times daily for 2 weeks 02/09/23     gabapentin (NEURONTIN) 300 MG capsule Take 1 capsule (300 mg total) by mouth at bedtime for 3 days, THEN 1 capsule (300 mg total) 2 (two) times daily for 3 days, THEN 1 capsule (300 mg total) 3 (three) times daily 12/27/22 01/27/23    hydrochlorothiazide (HYDRODIURIL) 12.5 MG tablet Take 1 tablet (12.5 mg total) by mouth daily. 08/09/22     HYDROcodone-acetaminophen (NORCO) 10-325 MG tablet Take 1 tablet by mouth 4 (four) times daily as needed for 5 days 12/12/22     HYDROcodone-acetaminophen (NORCO) 10-325 MG tablet Take 1 tablet by mouth 4 (four) times daily as needed. 12/19/22     Melatonin 3 MG TABS Take 1 tablet by mouth at bedtime.    [provider]  MINIVELLE 0.075 MG/24HR  02/29/16   [provider]  Multiple Vitamin (MULTIVITAMIN) capsule Take 1 capsule by mouth  daily.    [provider]  Multiple Vitamins-Minerals (EYE VITAMINS PO) Take by mouth daily.    [provider]  mupirocin ointment (BACTROBAN) 2 % Apply small amount to affected area once daily 01/23/23     Omega-3 Fatty Acids (FISH OIL) 1200 MG CAPS Take 2 capsules by mouth daily.    [provider]  Probiotic Product (PROBIOTIC PO) Take 1 capsule by mouth daily.    [provider]  progesterone (PROMETRIUM) 100 MG capsule  03/16/16   [provider]  progesterone (PROMETRIUM) 100 MG capsule Take 1 capsule (100 mg total) by mouth every night at bedtime. 11/21/22   Marcelle Overlie, MD  progesterone (PROMETRIUM) 100 MG capsule Take 1 capsule (100 mg total) by mouth daily. 12/01/22      tirzepatide (ZEPBOUND) 10 MG/0.5ML Pen Inject 10 mg into the skin once a week. 12/09/22     tirzepatide (ZEPBOUND) 12.5 MG/0.5ML Pen Inject 12.5 mg into the skin once a week as directed 01/31/23     tirzepatide (ZEPBOUND) 2.5 MG/0.5ML Pen Inject 2.5 mg into the skin once a week for 4 weeks, then increase as directed 10/25/22     tirzepatide (ZEPBOUND) 5 MG/0.5ML Pen Inject 5 mg into the skin once a week. 10/25/22     tirzepatide (ZEPBOUND) 7.5 MG/0.5ML Pen Inject 7.5 mg into the skin once a week. 12/01/22       Family History Family History  Problem Relation Age of Onset   Alzheimer's disease Father    Diabetes Maternal Grandmother    Dementia Paternal Grandmother    Prostate cancer Paternal Grandfather    Heart attack Paternal Grandfather    Heart attack Brother    Breast cancer Neg Hx     Social History Social History   Tobacco Use   Smoking status: Never   Smokeless tobacco: Never  Substance Use Topics   Alcohol use: Yes    Comment: 4 drinks per week   Drug use: No     Allergies   Topamax [topiramate]   Review of Systems Review of Systems  Neurological:  Positive for headaches.  All other systems reviewed and are negative.    Physical Exam Triage Vital Signs ED Triage Vitals  Encounter Vitals Group     BP 04/28/23 1613 (!) 149/95     Systolic BP Percentile --      Diastolic BP Percentile --      Pulse Rate 04/28/23 1613 64     Resp 04/28/23 1613 20     Temp 04/28/23 1613 98.6 F (37 C)     Temp src --      SpO2 04/28/23 1613 96 %     Weight --      Height --      Head Circumference --      Peak Flow --      Pain Score 04/28/23 1609 9     Pain Loc --      Pain Education --      Exclude from Growth Chart --    No data found.  Updated Vital Signs BP (!) 149/95   Pulse 64   Temp 98.6 F (37 C)   Resp 20   SpO2 96%   Visual Acuity Right Eye Distance:   Left Eye Distance:   Bilateral Distance:    Right Eye Near:   Left Eye Near:    Bilateral  Near:     Physical Exam Vitals reviewed.  Constitutional:  Appearance: Normal appearance.  HENT:     Head: Normocephalic and atraumatic.     Right Ear: Tympanic membrane normal.     Left Ear: Tympanic membrane normal.     Ears:     Comments: Small canals.      Mouth/Throat:     Mouth: Mucous membranes are moist.  Eyes:     Extraocular Movements: Extraocular movements intact.     Pupils: Pupils are equal, round, and reactive to light.  Cardiovascular:     Rate and Rhythm: Normal rate.  Pulmonary:     Effort: Pulmonary effort is normal.  Neurological:     Mental Status: She is alert and oriented to person, place, and time.     Cranial Nerves: No cranial nerve deficit.     Comments: Tender right temporal area   Psychiatric:        Mood and Affect: Mood normal.      UC Treatments / Results  Labs (all labs ordered are listed, but only abnormal results are displayed) Labs Reviewed - No data to display  EKG   Radiology No results found.  Procedures Procedures (including critical care time)  Medications Ordered in UC Medications - No data to display  Initial Impression / Assessment and Plan / UC Course  I have reviewed the triage vital signs and the nursing notes.  Pertinent labs & imaging results that were available during my care of the patient were reviewed by me and considered in my medical decision making (see chart for details).   I discussed pt with Dr. Marlinda Mike.  Pt needs labs and possible imaging.    Pt agrees to go to ED. Pt plans to go to [facility] ED.   Final Clinical Impressions(s) / UC Diagnoses   Final diagnoses:  Right facial pain  Temporal pain   Discharge Instructions   None    ED Prescriptions   None    PDMP not reviewed this encounter.   Elson Areas, PA-C 04/28/23 1708       Elson Areas, [place] 04/28/23 [phone]

## 2023-04-28 NOTE — ED Provider Notes (Signed)
[place] EMERGENCY DEPARTMENT AT [facility] Provider Note   CSN: [id number] Arrival date & time: 04/28/23  1714     History {Add pertinent medical, surgical, social history, OB history to HPI:1} Chief Complaint  Patient presents with   Headache    Kerri Gonzalez is a 62 y.o. female.   Headache      Home Medications Prior to Admission medications   Medication Sig Start Date End Date Taking? Authorizing Provider  carbamazepine (TEGRETOL) 200 MG tablet Take 1 tablet (200 mg total) by mouth 4 (four) times daily. 04/28/23 05/28/23 Yes MessickNoralyn Pick, MD  HYDROcodone-acetaminophen (NORCO/VICODIN) 5-325 MG tablet Take 1 tablet by mouth every 6 (six) hours as needed. 04/28/23  Yes Wynetta Fines, MD  Cholecalciferol (VITAMIN D3) 5000 units CAPS Take 1 capsule by mouth daily.    [provider]  co-enzyme Q-10 30 MG capsule Take 30 mg by mouth daily.    [provider]  Cyanocobalamin (B12 LIQUID HEALTH BOOSTER PO) Take 1 mL by mouth daily.    [provider]  diclofenac Sodium (VOLTAREN) 1 % GEL Apply 2 g topically 4 (four) times daily. 03/02/23     estradiol (EVAMIST) 1.53 MG/SPRAY transdermal spray Place 1 spray to the inner arm once daily 10/25/22   Marcelle Overlie, MD  estradiol (EVAMIST) 1.53 MG/SPRAY transdermal spray USE AS DIRECTED 12/01/22     fluorometholone (FML) 0.1 % ophthalmic suspension Place 1 drop into the left eye 2 (two) times daily for 2 weeks 02/09/23     gabapentin (NEURONTIN) 300 MG capsule Take 1 capsule (300 mg total) by mouth at bedtime for 3 days, THEN 1 capsule (300 mg total) 2 (two) times daily for 3 days, THEN 1 capsule (300 mg total) 3 (three) times daily 12/27/22 01/27/23    hydrochlorothiazide (HYDRODIURIL) 12.5 MG tablet Take 1 tablet (12.5 mg total) by mouth daily. 08/09/22     HYDROcodone-acetaminophen (NORCO) 10-325 MG tablet Take 1 tablet by mouth 4 (four) times daily as needed for 5 days 12/12/22      HYDROcodone-acetaminophen (NORCO) 10-325 MG tablet Take 1 tablet by mouth 4 (four) times daily as needed. 12/19/22     Melatonin 3 MG TABS Take 1 tablet by mouth at bedtime.    [provider]  MINIVELLE 0.075 MG/24HR  02/29/16   [provider]  Multiple Vitamin (MULTIVITAMIN) capsule Take 1 capsule by mouth daily.    [provider]  Multiple Vitamins-Minerals (EYE VITAMINS PO) Take by mouth daily.    [provider]  mupirocin ointment (BACTROBAN) 2 % Apply small amount to affected area once daily 01/23/23     Omega-3 Fatty Acids (FISH OIL) 1200 MG CAPS Take 2 capsules by mouth daily.    [provider]  Probiotic Product (PROBIOTIC PO) Take 1 capsule by mouth daily.    [provider]  progesterone (PROMETRIUM) 100 MG capsule  03/16/16   [provider]  progesterone (PROMETRIUM) 100 MG capsule Take 1 capsule (100 mg total) by mouth every night at bedtime. 11/21/22   Marcelle Overlie, MD  progesterone (PROMETRIUM) 100 MG capsule Take 1 capsule (100 mg total) by mouth daily. 12/01/22     tirzepatide (ZEPBOUND) 10 MG/0.5ML Pen Inject 10 mg into the skin once a week. 12/09/22     tirzepatide (ZEPBOUND) 12.5 MG/0.5ML Pen Inject 12.5 mg into the skin once a week as directed 01/31/23     tirzepatide (ZEPBOUND) 2.5 MG/0.5ML Pen Inject 2.5 mg  into the skin once a week for 4 weeks, then increase as directed 10/25/22     tirzepatide (ZEPBOUND) 5 MG/0.5ML Pen Inject 5 mg into the skin once a week. 10/25/22     tirzepatide (ZEPBOUND) 7.5 MG/0.5ML Pen Inject 7.5 mg into the skin once a week. 12/01/22         Allergies    Topamax [topiramate]    Review of Systems   Review of Systems  Neurological:  Positive for headaches.    Physical Exam Updated Vital Signs BP 122/83 (BP Location: Left Arm)   Pulse 64   Temp 98 F (36.7 C) (Oral)   Resp 16   Ht 5\' 2"  (1.575 m)   Wt 65.8 kg   SpO2 97%   BMI 26.52 kg/m  Physical Exam  ED Results /  Procedures / Treatments   Labs (all labs ordered are listed, but only abnormal results are displayed) Labs Reviewed  CBC WITH DIFFERENTIAL/PLATELET  BASIC METABOLIC PANEL  SEDIMENTATION RATE  C-REACTIVE PROTEIN    EKG None  Radiology CT HEAD WO CONTRAST ( )  Result Date: 04/28/2023 CLINICAL DATA:  Headache EXAM: CT HEAD WITHOUT CONTRAST TECHNIQUE: Contiguous axial images were obtained from the base of the skull through the vertex without intravenous contrast. RADIATION DOSE REDUCTION: This exam was performed according to the departmental dose-optimization program which includes automated exposure control, adjustment of the mA and/or kV according to patient size and/or use of iterative reconstruction technique. COMPARISON:  Head CT 08/14/2010.  MRI brain 09/03/2010. FINDINGS: Brain: No evidence of acute infarction, hemorrhage, hydrocephalus, extra-axial collection or mass lesion/mass effect. Vascular: No hyperdense vessel or unexpected calcification. Skull: Normal. Negative for fracture or focal lesion. Sinuses/Orbits: No acute finding. Other: None. IMPRESSION: No acute intracranial abnormality. Electronically Signed   By: Darliss Cheney M.D.   On: 04/28/2023 19:24    Procedures Procedures  {Document cardiac monitor, telemetry assessment procedure when appropriate:1}  Medications Ordered in ED Medications  carbamazepine (TEGRETOL) tablet 200 mg (has no administration in time range)  LORazepam (ATIVAN) injection 0.5 mg (0.5 mg Intravenous Given 04/28/23 2034)  morphine (PF) 4 MG/ML injection 4 mg (4 mg Intravenous Given 04/28/23 2034)  ondansetron (ZOFRAN) injection 4 mg (4 mg Intravenous Given 04/28/23 2034)  diphenhydrAMINE (BENADRYL) injection 50 mg (50 mg Intravenous Given 04/28/23 2034)    ED Course/ Medical Decision Making/ A&P   {   Click here for ABCD2, HEART and other calculatorsREFRESH Note before signing :1}                              Medical Decision  Making Risk Prescription drug management.   ***  {Document critical care time when appropriate:1} {Document review of labs and clinical decision tools ie heart score, Chads2Vasc2 etc:1}  {Document your independent review of radiology images, and any outside records:1} {Document your discussion with family members, caretakers, and with consultants:1} {Document social determinants of health affecting pt's care:1} {Document your decision making why or why not admission, treatments were needed:1} Final Clinical Impression(s) / ED Diagnoses Final diagnoses:  Facial pain    Rx / DC Orders ED Discharge Orders          Ordered    HYDROcodone-acetaminophen (NORCO/VICODIN) 5-325 MG tablet  Every 6 hours PRN        04/28/23 2219    carbamazepine (TEGRETOL) 200 MG tablet  4 times daily        04/28/23 2219

## 2023-04-28 NOTE — Discharge Instructions (Addendum)
Return for any problem.  ?

## 2023-04-28 NOTE — ED Triage Notes (Signed)
Pt reports Rt ear pain radiating to temp . Pt reports the pain is in shocks.

## 2023-05-01 ENCOUNTER — Other Ambulatory Visit (HOSPITAL_COMMUNITY): Payer: Self-pay

## 2023-05-01 ENCOUNTER — Ambulatory Visit: Payer: Managed Care, Other (non HMO) | Admitting: Physical Therapy

## 2023-05-02 ENCOUNTER — Other Ambulatory Visit: Payer: Self-pay | Admitting: Internal Medicine

## 2023-05-02 ENCOUNTER — Other Ambulatory Visit (HOSPITAL_COMMUNITY): Payer: Self-pay

## 2023-05-02 ENCOUNTER — Other Ambulatory Visit (HOSPITAL_BASED_OUTPATIENT_CLINIC_OR_DEPARTMENT_OTHER): Payer: Self-pay

## 2023-05-02 DIAGNOSIS — G500 Trigeminal neuralgia: Secondary | ICD-10-CM

## 2023-05-02 MED ORDER — PREDNISONE 10 MG (21) PO TBPK
ORAL_TABLET | ORAL | 0 refills | Status: AC
  Filled 2023-05-02: qty 21, 6d supply, fill #0

## 2023-05-02 MED ORDER — PREDNISONE 10 MG PO TABS
ORAL_TABLET | ORAL | 0 refills | Status: AC
  Filled 2023-05-02: qty 21, 6d supply, fill #0

## 2023-05-03 ENCOUNTER — Encounter: Payer: Self-pay | Admitting: Internal Medicine

## 2023-05-03 ENCOUNTER — Ambulatory Visit: Payer: Managed Care, Other (non HMO) | Admitting: Physical Therapy

## 2023-05-06 ENCOUNTER — Other Ambulatory Visit (HOSPITAL_COMMUNITY): Payer: Self-pay

## 2023-05-07 ENCOUNTER — Other Ambulatory Visit (HOSPITAL_COMMUNITY): Payer: Self-pay

## 2023-05-07 ENCOUNTER — Other Ambulatory Visit: Payer: Managed Care, Other (non HMO)

## 2023-05-08 ENCOUNTER — Other Ambulatory Visit: Payer: Self-pay

## 2023-05-08 ENCOUNTER — Ambulatory Visit: Payer: Managed Care, Other (non HMO) | Admitting: Neurology

## 2023-05-08 ENCOUNTER — Other Ambulatory Visit (HOSPITAL_COMMUNITY): Payer: Self-pay

## 2023-05-08 ENCOUNTER — Other Ambulatory Visit (HOSPITAL_BASED_OUTPATIENT_CLINIC_OR_DEPARTMENT_OTHER): Payer: Self-pay

## 2023-05-08 ENCOUNTER — Ambulatory Visit: Payer: Managed Care, Other (non HMO) | Admitting: Physical Therapy

## 2023-05-09 ENCOUNTER — Other Ambulatory Visit (HOSPITAL_BASED_OUTPATIENT_CLINIC_OR_DEPARTMENT_OTHER): Payer: Self-pay

## 2023-05-09 ENCOUNTER — Other Ambulatory Visit (HOSPITAL_COMMUNITY): Payer: Self-pay

## 2023-05-09 MED ORDER — ERYTHROMYCIN 5 MG/GM OP OINT
TOPICAL_OINTMENT | OPHTHALMIC | 1 refills | Status: AC
  Filled 2023-05-09: qty 3.5, 5d supply, fill #0

## 2023-05-10 ENCOUNTER — Encounter: Payer: Managed Care, Other (non HMO) | Admitting: Physical Therapy

## 2023-05-11 ENCOUNTER — Ambulatory Visit: Payer: Managed Care, Other (non HMO) | Admitting: Physical Therapy

## 2023-05-15 ENCOUNTER — Ambulatory Visit: Payer: Managed Care, Other (non HMO) | Admitting: Physical Therapy

## 2023-05-16 ENCOUNTER — Ambulatory Visit: Payer: Managed Care, Other (non HMO) | Attending: Neurological Surgery | Admitting: Physical Therapy

## 2023-05-16 ENCOUNTER — Encounter: Payer: Self-pay | Admitting: Physical Therapy

## 2023-05-16 DIAGNOSIS — M6281 Muscle weakness (generalized): Secondary | ICD-10-CM | POA: Diagnosis present

## 2023-05-16 DIAGNOSIS — M25571 Pain in right ankle and joints of right foot: Secondary | ICD-10-CM | POA: Insufficient documentation

## 2023-05-16 DIAGNOSIS — M5459 Other low back pain: Secondary | ICD-10-CM | POA: Insufficient documentation

## 2023-05-16 NOTE — Therapy (Signed)
OUTPATIENT PHYSICAL THERAPY TREATMENT   Patient Name: Kerri Gonzalez MRN: [id number] DOB:[birth year], 62 y.o., female Today's Date: 05/16/2023  END OF SESSION:  PT End of Session - 05/16/23 1023     Visit Number 10    Number of Visits 18    Date for PT Re-Evaluation 06/27/23    Authorization Type [employer]    Progress Note Due on Visit 10    PT Start Time 1020    PT Stop Time 1106    PT Time Calculation (min) 46 min    Activity Tolerance Patient tolerated treatment well;Patient limited by pain    Behavior During Therapy [facility] for tasks assessed/performed               Past Medical History:  Diagnosis Date   Meniere disease    Ulcerative colitis (HCC)    Past Surgical History:  Procedure Laterality Date   LUMBAR DISC SURGERY  2010   LUMBAR DISC SURGERY  2013   Patient Active Problem List   Diagnosis Date Noted   Memory change 03/18/2016    PCP: Cleatis Polka., MD   REFERRING PROVIDER: Barnett Abu, MD   REFERRING DIAG: Spondylolisthesis, lumbar region [M43.16]   Rationale for Evaluation and Treatment: Rehabilitation  THERAPY DIAG:  Pain in right ankle and joints of right foot  Other low back pain  Muscle weakness (generalized)  ONSET DATE: 2010  SUBJECTIVE:                                                                                                                                                                                           SUBJECTIVE STATEMENT: Pt had shingles, was out for 2.5 weeks. Pain is so bad in her lateral Rt leg, calf, heel (medial) because she has been avoiding pressure on her medial heel. Tried to get a massage appt but not until Jan .    PERTINENT HISTORY:  Hx of multiple lumbar discectomies  PAIN:  Back Are you having pain? Yes: NPRS scale: Not rated today 8/10 pain location:Rt leg and ankle  Pain description: intenise, aggrivating Aggravating factors: lifting , walking, stairs Relieving factors: stretching (cat  cow), bird dog, heat/ Aleve and Tylenol    PRECAUTIONS: None  RED FLAGS: None   WEIGHT BEARING RESTRICTIONS: No  FALLS:  Has patient fallen in last 6 months? No  LIVING ENVIRONMENT: Lives with: lives with their family Lives in: House/apartment Stairs: Yes: Internal: 20 steps; on left going up Has following equipment at home: None  OCCUPATION: Research officer, [employer]  PLOF: Independent  PATIENT GOALS: be pain free, be more active beyond pilates  OBJECTIVE:  Note: Objective measures were completed at Evaluation unless otherwise noted.  DIAGNOSTIC FINDINGS:  N/A  PATIENT SURVEYS:  FOTO 42% predicted 59% 04/19/2023 48%  05/16/23  SCREENING FOR RED FLAGS: Bowel or bladder incontinence: No Cauda equina syndrome: No   COGNITION: Overall cognitive status: Within functional limits for tasks assessed     SENSATION: Light touch: slight numbness in  R L1 compared bil   POSTURE: rounded shoulders and forward head  PALPATION: TTP along the L SIJ and R piriformis. Soreness noted along the R distal calf/ achilles into insertion.    L length assessment:  03/27/2023 True: R:81.2 cm L: 82.1 cm  Apparent: R: 89.9 L: 90.5  LUMBAR ROM:   AROM eval  Flexion 118 P! End range   Extension 15P  Right lateral flexion 18  Left lateral flexion 18 P!  Right rotation   Left rotation    (Blank rows = not tested)  NOTE: pain referred to the L SIJ   LOWER EXTREMITY ROM:     Active  Right eval Left eval  Hip flexion    Hip extension    Hip abduction    Hip adduction    Hip internal rotation    Hip external rotation    Knee flexion    Knee extension    Ankle dorsiflexion    Ankle plantarflexion    Ankle inversion    Ankle eversion     (Blank rows = not tested)  LOWER EXTREMITY MMT:    MMT Right eval Left eval Right 04/25/23 Left 04/25/23  Hip flexion 4- 3+ 4+ 4+  Hip extension 4- 3+ 4 4  Hip abduction 4- 3+ 4- 4  Hip adduction      Hip internal  rotation      Hip external rotation      Knee flexion   5 5  Knee extension   5 5  Ankle dorsiflexion   5 5  Ankle plantarflexion      Ankle inversion      Ankle eversion       (Blank rows = not tested)  LUMBAR SPECIAL TESTS:  SI Compression/distraction test: Positive on the L and Gaenslen's test: Positive  FUNCTIONAL TESTS:  30 seconds chair stand test  GAIT: Distance walked: 110 ft to treatment room  Assistive device utilized: None Level of assistance: Complete Independence Comments: mild antalgic pattern with decreased toe off on the R and and step length on the L  TREATMENT:      [facility] Adult PT Treatment:                                                DATE: 05/16/23 Therapeutic Exercise: Recumbent bike L 2 for 5 min  Slant board  Supine hamstring, calf and ITB stretching  Manual Therapy: MTPR along the R glute med, Rt lateral thigh, R soleus/gastroc. And Rt foot/ankle IASTM along the lateral thigh and calf  Soft tissue mobilization to above muscles LAD with adduction  [facility] Adult PT Treatment:                                                DATE: 04/25/23 Therapeutic Exercise: Hip abduction  x 15  Blue band clam x 15   Forearm hip extension 2 sets  x 10 knee bent and then ext.  Bird dog hold x 5 each 5 sec Rt leg weaker and less stable when in contact with the mat  Sit to stand with band on thighs Hip hinge on foam pad x 10 with light touch to countertop  Figure 4 x 2-3  Single leg balance on foam  Tandem stance on foam pad Narrow stance EO, EC Arms overhead with head turns, EO Manual Therapy: NA today     [facility] Adult PT Treatment:                                                DATE: 04/24/2023 Therapeutic Exercise: Sidelying R hip isometric adduction 1 x 5 holding 10 seconds, 2nd set performed concordant with manual  QL stretching 2 x 30 sec L sidelying R glute med stretch 2 x 30 sec 1 x soleus/ gastroc stretch  Manual Therapy: MTPR along the R glute med, R  solesu IASTM along the Medial calcaneal tubercle Self Care: Reviwed importance of walking and normal as possible avoding compensation as well as over corrections to prevent issues.    [facility] Adult PT Treatment:                                                DATE: 04/21/23 Therapeutic Exercise: Reformer : 2 red 1 green, ball under pelvis  Arches, heels, toes Double and single leg Ball used between ankles to improve great toe extension Manual Therapy: Prone soft tissue work to bilateral gastrocsoleus Prone soft tissue work to right plantar fascia and surrounding heel deep tissue work with trigger point release  Self Care: Recommendations for modifications during Pilates class Recommendations for massage therapist                                                                                                                             [facility] Adult PT Treatment:                                                DATE: 04/19/2023 Therapeutic Exercise: Calf stretch knee extending contract/ relax  Manual Therapy: MTRP along the posterior tib on the R, and medial soleus Talorcrural LAD grade IV oscillations Desensitization of the medial aspect of the ankle with towel   Therapeutic Activity: Reviewed FOTO assessment and discussed improvement in score and how it relates to her function and modifications to ADLs while maintaining efficient form is a good continued to progress with  activity  Self Care: Reviewed posture in the way of lifting related to using a golfers lift  Discussed Chronic pain and nerve adaptation and benefits of techniques of treatment/ exercises   PATIENT EDUCATION: 04/19/2023 Education details: discussed using a frozen water bottle and rolling on the floor on her foot to help with relief on the PF and ice for pain relief.  Person educated: Patient Education method: Explanation, Verbal cues, and Handouts Education comprehension: verbalized understanding  HOME EXERCISE  PROGRAM: Access Code: [email or web address] URL: [email or web address] Date: 04/13/2023 Prepared by: Lulu Riding  Exercises - Seated Hamstring Stretch (Mirrored)  - 1 x daily - 7 x weekly - 2 sets - 2 reps - 30 hold - SLR  - 1 x daily - 7 x weekly - 3 sets - 13 reps - 1 hold - Supine Piriformis Stretch  - 1 x daily - 7 x weekly - 2 sets - 2 reps - 30 -60 hold - Sidelying Hip Abduction  - 1 x daily - 7 x weekly - 3 sets - 15 reps - Seated Calf Stretch with Strap  - 1 x daily - 7 x weekly - 2 sets - 10 reps - 30 -60 hold - Toe Yoga - Alternating Great Toe and Lesser Toe Extension  - 1 x daily - 7 x weekly - 2 sets - 10 reps - Towel Scrunches  - 1 x daily - 7 x weekly - 5 sets  ASSESSMENT:  CLINICAL IMPRESSION: Patient was improving up until about 2 weeks ago when she had severe pain in face/eye, shingles. She has not exercised (pilates) since that time. She had increased pain in her Rt thigh, calf and medial Rt ankle today.  The pain is not the type of pain she recalls from her back pain .  FOTO score was improved for her back (51%).  She would like to continue therapy to address lumbar issues along with the pain in her Rt foot and compensations she has adopted as a result. Cont POC.    OBJECTIVE IMPAIRMENTS: Abnormal gait, decreased balance, decreased endurance, decreased ROM, decreased strength, improper body mechanics, postural dysfunction, and pain.   ACTIVITY LIMITATIONS: carrying, lifting, standing, squatting, stairs, and locomotion level  PARTICIPATION LIMITATIONS: shopping, community activity, occupation, yard work, and school  PERSONAL FACTORS: Age, Past/current experiences, Time since onset of injury/illness/exacerbation, and 1 comorbidity: hx of discectomies  are also affecting patient's functional outcome.   REHAB POTENTIAL: Good  CLINICAL DECISION MAKING: Evolving/moderate complexity  EVALUATION COMPLEXITY: Moderate   GOALS: Goals reviewed with patient?  Yes  SHORT TERM GOALS: Target date: 04/10/2023  Pt to be IND with initial HEP to assist with therapeutic progression Baseline: Goal status: Met on 04/13/2023  2.  Pt to verbalize/ demo efficient posture and lifting mechanics to prevent and reduce low back pain Baseline:  Goal status: progressing 04/13/2023  3.  Pt to be able to sit , stand and walk for >/= 15 min with </= max of 6/10 pain  Baseline:  Goal status: progressing 04/13/2023  4.  Improve FOTO score to >/48% to demo improving function Baseline:  Goal status: not reassessed on 04/13/2023  LONG TERM GOALS: Target date: 05/08/2023  Pt to be increase gross LE strength to >/=4 to promote functional strength to assist with lifting/ carrying activities as related to her job requirements Baseline:  Goal status: see above, ongoing   2.  Pt to be able to stand and walk for >/=1 hour and perfrom all lifting activities  related to her job described as min difficult  with </= 2/10 max pain  Baseline:  Goal status: ongoing   3.  Pt to report no referred RLE symptoms for >/= 1 week for improvement in function and QOL. Baseline:  Goal status: ongoing   4.  Pt to improve her FOTO score to >/= 59 To demonstrate improvement in function Baseline: 42% Goal status: 51%, DC   5.  Pt to be IND with all HEP and is able to maintain and progress her current LOF IND.  Baseline:  Goal status: ongoing  PLAN:  PT FREQUENCY: 1-2x/week  PT DURATION: 8 weeks  PLANNED INTERVENTIONS: Therapeutic exercises, Therapeutic activity, Neuromuscular re-education, Balance training, Gait training, Patient/Family education, Self Care, Joint mobilization, Joint manipulation, Stair training, Aquatic Therapy, Dry Needling, Spinal manipulation, Spinal mobilization, Cryotherapy, Moist heat, Taping, Traction, Ionotophoresis 4mg /ml Dexamethasone, Manual therapy, and Re-evaluation.  PLAN FOR NEXT SESSION: closed chain balance and ankle/hip/core stability. Manual to RLE  .   review / update HEP PRN. Measure LLD, stretch hamstring on L/ strengthening hip flexors on the L, STW for piriformis on the R, gross hip strengthening, stair training. STW on the R calf, calf stretching. Gait training.    Karie Mainland, PT 05/16/23 11:24 AM Phone: [phone] Fax: [phone]

## 2023-05-17 ENCOUNTER — Encounter: Payer: Self-pay | Admitting: Physical Therapy

## 2023-05-18 ENCOUNTER — Ambulatory Visit: Payer: Managed Care, Other (non HMO) | Admitting: Physical Therapy

## 2023-05-18 DIAGNOSIS — M25571 Pain in right ankle and joints of right foot: Secondary | ICD-10-CM

## 2023-05-18 DIAGNOSIS — M5459 Other low back pain: Secondary | ICD-10-CM

## 2023-05-18 DIAGNOSIS — M6281 Muscle weakness (generalized): Secondary | ICD-10-CM

## 2023-05-18 NOTE — Therapy (Signed)
OUTPATIENT PHYSICAL THERAPY TREATMENT   Patient Name: Kerri Gonzalez MRN: [id number] DOB:1961-03-06, 62 y.o., female Today's Date: 05/18/2023  END OF SESSION:  PT End of Session - 05/18/23 1155     Visit Number 11    Number of Visits 18    Date for PT Re-Evaluation 06/27/23    Authorization Type [employer]    Progress Note Due on Visit 10    PT Start Time 1150    PT Stop Time 1230    PT Time Calculation (min) 40 min    Activity Tolerance Patient tolerated treatment well;Patient limited by pain    Behavior During Therapy [facility] for tasks assessed/performed                Past Medical History:  Diagnosis Date   Meniere disease    Ulcerative colitis (HCC)    Past Surgical History:  Procedure Laterality Date   LUMBAR DISC SURGERY  2010   LUMBAR DISC SURGERY  2013   Patient Active Problem List   Diagnosis Date Noted   Memory change 03/18/2016    PCP: Cleatis Polka., MD   REFERRING PROVIDER: Barnett Abu, MD   REFERRING DIAG: Spondylolisthesis, lumbar region [M43.16]   Rationale for Evaluation and Treatment: Rehabilitation  THERAPY DIAG:  Pain in right ankle and joints of right foot  Other low back pain  Muscle weakness (generalized)  ONSET DATE: 2010  SUBJECTIVE:                                                                                                                                                                                           SUBJECTIVE STATEMENT: Patient with less hip thigh pain ankle still 6/10-7/10   PERTINENT HISTORY:  Hx of multiple lumbar discectomies  PAIN:  Back Are you having pain? Yes: NPRS scale: Not rated today  pain location:Rt leg and ankle  Pain description: intenise, aggrivating Aggravating factors: lifting , walking, stairs Relieving factors: stretching (cat cow), bird dog, heat/ Aleve and Tylenol    PRECAUTIONS: None  RED FLAGS: None   WEIGHT BEARING RESTRICTIONS: No  FALLS:  Has patient fallen in  last 6 months? No  LIVING ENVIRONMENT: Lives with: lives with their family Lives in: House/apartment Stairs: Yes: Internal: 20 steps; on left going up Has following equipment at home: None  OCCUPATION: Research officer, trade union  PLOF: Independent  PATIENT GOALS: be pain free, be more active beyond pilates   OBJECTIVE:  Note: Objective measures were completed at Evaluation unless otherwise noted.  DIAGNOSTIC FINDINGS:  N/A  PATIENT SURVEYS:  FOTO 42% predicted 59% 04/19/2023 48%  05/16/23  SCREENING FOR  RED FLAGS: Bowel or bladder incontinence: No Cauda equina syndrome: No   COGNITION: Overall cognitive status: Within functional limits for tasks assessed     SENSATION: Light touch: slight numbness in  R L1 compared bil   POSTURE: rounded shoulders and forward head  PALPATION: TTP along the L SIJ and R piriformis. Soreness noted along the R distal calf/ achilles into insertion.    L length assessment:  03/27/2023 True: R:81.2 cm L: 82.1 cm  Apparent: R: 89.9 L: 90.5  LUMBAR ROM:   AROM eval  Flexion 118 P! End range   Extension 15P  Right lateral flexion 18  Left lateral flexion 18 P!  Right rotation   Left rotation    (Blank rows = not tested)  NOTE: pain referred to the L SIJ   LOWER EXTREMITY ROM:     Active  Right eval Left eval  Hip flexion    Hip extension    Hip abduction    Hip adduction    Hip internal rotation    Hip external rotation    Knee flexion    Knee extension    Ankle dorsiflexion    Ankle plantarflexion    Ankle inversion    Ankle eversion     (Blank rows = not tested)  LOWER EXTREMITY MMT:    MMT Right eval Left eval Right 04/25/23 Left 04/25/23  Hip flexion 4- 3+ 4+ 4+  Hip extension 4- 3+ 4 4  Hip abduction 4- 3+ 4- 4  Hip adduction      Hip internal rotation      Hip external rotation      Knee flexion   5 5  Knee extension   5 5  Ankle dorsiflexion   5 5  Ankle plantarflexion      Ankle inversion       Ankle eversion       (Blank rows = not tested)  LUMBAR SPECIAL TESTS:  SI Compression/distraction test: Positive on the L and Gaenslen's test: Positive  FUNCTIONAL TESTS:  30 seconds chair stand test  GAIT: Distance walked: 110 ft to treatment room  Assistive device utilized: None Level of assistance: Complete Independence Comments: mild antalgic pattern with decreased toe off on the R and and step length on the L  TREATMENT:       [facility] Adult PT Treatment:                                                DATE: 05/18/23 Therapeutic Exercise: Wall warm up: heel raise , toe tap and foot circles Slant board 1 min  Hip abduction, flexion standing on balance disc  Spring board: single leg hinge with springs Hip hinge to SLS light UE touch  SLS with KB pass 10 lbs  Tandem with KB 10 lbs pass  Hamstring and ITB stretch  Blue band inversion   Manual Therapy: TrP, soft tissue mobilization to posterior tibialis, calf  [facility] Adult PT Treatment:                                                DATE: 05/16/23 Therapeutic Exercise: Recumbent bike L 2 for 5 min  Slant board  Supine hamstring, calf and  ITB stretching  Manual Therapy: MTPR along the R glute med, Rt lateral thigh, R soleus/gastroc. And Rt foot/ankle IASTM along the lateral thigh and calf  Soft tissue mobilization to above muscles LAD with adduction  [facility] Adult PT Treatment:                                                DATE: 04/25/23 Therapeutic Exercise: Hip abduction  x 15  Blue band clam x 15   Forearm hip extension 2 sets  x 10 knee bent and then ext.  Bird dog hold x 5 each 5 sec Rt leg weaker and less stable when in contact with the mat  Sit to stand with band on thighs Hip hinge on foam pad x 10 with light touch to countertop  Figure 4 x 2-3  Single leg balance on foam  Tandem stance on foam pad Narrow stance EO, EC Arms overhead with head turns, EO Manual Therapy: NA today     [facility] Adult PT Treatment:                                                 DATE: 04/24/2023 Therapeutic Exercise: Sidelying R hip isometric adduction 1 x 5 holding 10 seconds, 2nd set performed concordant with manual  QL stretching 2 x 30 sec L sidelying R glute med stretch 2 x 30 sec 1 x soleus/ gastroc stretch  Manual Therapy: MTPR along the R glute med, R solesu IASTM along the Medial calcaneal tubercle Self Care: Reviwed importance of walking and normal as possible avoding compensation as well as over corrections to prevent issues.    [facility] Adult PT Treatment:                                                DATE: 04/21/23 Therapeutic Exercise: Reformer : 2 red 1 green, ball under pelvis  Arches, heels, toes Double and single leg Ball used between ankles to improve great toe extension Manual Therapy: Prone soft tissue work to bilateral gastrocsoleus Prone soft tissue work to right plantar fascia and surrounding heel deep tissue work with trigger point release  Self Care: Recommendations for modifications during Pilates class Recommendations for massage therapist                                                                                                                             [facility] Adult PT Treatment:  DATE: 04/19/2023 Therapeutic Exercise: Calf stretch knee extending contract/ relax  Manual Therapy: MTRP along the posterior tib on the R, and medial soleus Talorcrural LAD grade IV oscillations Desensitization of the medial aspect of the ankle with towel   Therapeutic Activity: Reviewed FOTO assessment and discussed improvement in score and how it relates to her function and modifications to ADLs while maintaining efficient form is a good continued to progress with activity  Self Care: Reviewed posture in the way of lifting related to using a golfers lift  Discussed Chronic pain and nerve adaptation and benefits of techniques of treatment/  exercises   PATIENT EDUCATION: 04/19/2023 Education details: discussed using a frozen water bottle and rolling on the floor on her foot to help with relief on the PF and ice for pain relief.  Person educated: Patient Education method: Explanation, Verbal cues, and Handouts Education comprehension: verbalized understanding  HOME EXERCISE PROGRAM: Access Code: [email or web address] URL: [email or web address] Date: 04/13/2023 Prepared by: Lulu Riding  Exercises - Seated Hamstring Stretch (Mirrored)  - 1 x daily - 7 x weekly - 2 sets - 2 reps - 30 hold - SLR  - 1 x daily - 7 x weekly - 3 sets - 13 reps - 1 hold - Supine Piriformis Stretch  - 1 x daily - 7 x weekly - 2 sets - 2 reps - 30 -60 hold - Sidelying Hip Abduction  - 1 x daily - 7 x weekly - 3 sets - 15 reps - Seated Calf Stretch with Strap  - 1 x daily - 7 x weekly - 2 sets - 10 reps - 30 -60 hold - Toe Yoga - Alternating Great Toe and Lesser Toe Extension  - 1 x daily - 7 x weekly - 2 sets - 10 reps - Towel Scrunches  - 1 x daily - 7 x weekly - 5 sets -inversion  ASSESSMENT:  CLINICAL IMPRESSION: Patient with improved LE pain, but continued tightness and pain in Rt post tib and medial aspect of heel.  She does have a known tear in her plantar fascia and bone spur. She had no pain after the manual therapy.  Decreased single leg balance and needs cues for level pelvis.    OBJECTIVE IMPAIRMENTS: Abnormal gait, decreased balance, decreased endurance, decreased ROM, decreased strength, improper body mechanics, postural dysfunction, and pain.   ACTIVITY LIMITATIONS: carrying, lifting, standing, squatting, stairs, and locomotion level  PARTICIPATION LIMITATIONS: shopping, community activity, occupation, yard work, and school  PERSONAL FACTORS: Age, Past/current experiences, Time since onset of injury/illness/exacerbation, and 1 comorbidity: hx of discectomies  are also affecting patient's functional outcome.   REHAB  POTENTIAL: Good  CLINICAL DECISION MAKING: Evolving/moderate complexity  EVALUATION COMPLEXITY: Moderate   GOALS: Goals reviewed with patient? Yes  SHORT TERM GOALS: Target date: 04/10/2023  Pt to be IND with initial HEP to assist with therapeutic progression Baseline: Goal status: Met on 04/13/2023  2.  Pt to verbalize/ demo efficient posture and lifting mechanics to prevent and reduce low back pain Baseline:  Goal status: progressing 04/13/2023  3.  Pt to be able to sit , stand and walk for >/= 15 min with </= max of 6/10 pain  Baseline:  Goal status: progressing 04/13/2023  4.  Improve FOTO score to >/48% to demo improving function Baseline:  Goal status: not reassessed on 04/13/2023  LONG TERM GOALS: Target date: 05/08/2023  Pt to be increase gross LE strength to >/=4 to promote functional strength to assist with lifting/ carrying activities  as related to her job requirements Baseline:  Goal status: see above, ongoing   2.  Pt to be able to stand and walk for >/=1 hour and perfrom all lifting activities related to her job described as min difficult  with </= 2/10 max pain  Baseline:  Goal status: ongoing   3.  Pt to report no referred RLE symptoms for >/= 1 week for improvement in function and QOL. Baseline:  Goal status: ongoing   4.  Pt to improve her FOTO score to >/= 59 To demonstrate improvement in function Baseline: 42% Goal status: 51%, DC   5.  Pt to be IND with all HEP and is able to maintain and progress her current LOF IND.  Baseline:  Goal status: ongoing  PLAN:  PT FREQUENCY: 1-2x/week  PT DURATION: 8 weeks  PLANNED INTERVENTIONS: Therapeutic exercises, Therapeutic activity, Neuromuscular re-education, Balance training, Gait training, Patient/Family education, Self Care, Joint mobilization, Joint manipulation, Stair training, Aquatic Therapy, Dry Needling, Spinal manipulation, Spinal mobilization, Cryotherapy, Moist heat, Taping, Traction,  Ionotophoresis 4mg /ml Dexamethasone, Manual therapy, and Re-evaluation.  PLAN FOR NEXT SESSION: closed chain balance and ankle/hip/core stability. Manual to RLE .   review / update HEP PRN. Measure LLD, stretch hamstring on L/ strengthening hip flexors on the L, STW for piriformis on the R, gross hip strengthening, stair training. STW on the R calf, calf stretching. Gait training.    Karie Mainland, PT 05/18/23 12:48 PM Phone: [phone] Fax: [phone]

## 2023-05-23 ENCOUNTER — Ambulatory Visit: Payer: Managed Care, Other (non HMO) | Admitting: Physical Therapy

## 2023-05-23 ENCOUNTER — Encounter: Payer: Self-pay | Admitting: Physical Therapy

## 2023-05-23 DIAGNOSIS — M6281 Muscle weakness (generalized): Secondary | ICD-10-CM

## 2023-05-23 DIAGNOSIS — M5459 Other low back pain: Secondary | ICD-10-CM

## 2023-05-23 DIAGNOSIS — M25571 Pain in right ankle and joints of right foot: Secondary | ICD-10-CM | POA: Diagnosis not present

## 2023-05-23 NOTE — Therapy (Addendum)
 OUTPATIENT PHYSICAL THERAPY TREATMENT  PHYSICAL THERAPY DISCHARGE SUMMARY  Visits from Start of Care: 12  Current functional level related to goals / functional outcomes: See goals   Remaining deficits: Current status unknown   Education / Equipment: HEP, theraband, posture education   Patient agrees to discharge. Patient goals were partially met. Patient is being discharged due to not returning since the last visit.  Lulu Riding PT, DPT, LAT, ATC  09/07/23  10:47 AM        Patient Name: Kerri Gonzalez MRN: [id number] DOB:1960-10-07, 62 y.o., female Today's Date: 05/23/2023  END OF SESSION:  PT End of Session - 05/23/23 1021     Visit Number 12    Number of Visits 18    Date for PT Re-Evaluation 06/27/23    Authorization Type [employer]    PT Start Time 1022    PT Stop Time 1102    PT Time Calculation (min) 40 min                 Past Medical History:  Diagnosis Date   Meniere disease    Ulcerative colitis (HCC)    Past Surgical History:  Procedure Laterality Date   LUMBAR DISC SURGERY  2010   LUMBAR DISC SURGERY  2013   Patient Active Problem List   Diagnosis Date Noted   Memory change 03/18/2016    PCP: Cleatis Polka., MD   REFERRING PROVIDER: Barnett Abu, MD   REFERRING DIAG: Spondylolisthesis, lumbar region [M43.16]   Rationale for Evaluation and Treatment: Rehabilitation  THERAPY DIAG:  Pain in right ankle and joints of right foot  Other low back pain  Muscle weakness (generalized)  ONSET DATE: 2010  SUBJECTIVE:                                                                                                                                                                                           SUBJECTIVE STATEMENT: "I am still having a lot of pain in the R foot, the imaging has a tear in the PF and bone spurs."  PERTINENT HISTORY:  Hx of multiple lumbar discectomies  PAIN:  Back Are you having pain? Yes: NPRS  scale: 8/10 pain location:Rt leg and ankle  Pain description: intenise, aggrivating Aggravating factors: lifting , walking, stairs Relieving factors: stretching (cat cow), bird dog, heat/ Aleve and Tylenol    PRECAUTIONS: None  RED FLAGS: None   WEIGHT BEARING RESTRICTIONS: No  FALLS:  Has patient fallen in last 6 months? No  LIVING ENVIRONMENT: Lives with: lives with their family Lives in: House/apartment Stairs: Yes: Internal: 20 steps; on left  going up Has following equipment at home: None  OCCUPATION: Research officer, trade union  PLOF: Independent  PATIENT GOALS: be pain free, be more active beyond pilates   OBJECTIVE:  Note: Objective measures were completed at Evaluation unless otherwise noted.  DIAGNOSTIC FINDINGS:  N/A  PATIENT SURVEYS:  FOTO 42% predicted 59% 04/19/2023 48%  05/16/23  SCREENING FOR RED FLAGS: Bowel or bladder incontinence: No Cauda equina syndrome: No   COGNITION: Overall cognitive status: Within functional limits for tasks assessed     SENSATION: Light touch: slight numbness in  R L1 compared bil   POSTURE: rounded shoulders and forward head  PALPATION: TTP along the L SIJ and R piriformis. Soreness noted along the R distal calf/ achilles into insertion.    L length assessment:  03/27/2023 True: R:81.2 cm L: 82.1 cm  Apparent: R: 89.9 L: 90.5   Navicular drop assessment  05/23/2023 Sitting to standing   R .75 cm (pt guarded this side during  assessment)  L .85cm   LUMBAR ROM:   AROM eval  Flexion 118 P! End range   Extension 15P  Right lateral flexion 18  Left lateral flexion 18 P!  Right rotation   Left rotation    (Blank rows = not tested)  NOTE: pain referred to the L SIJ   LOWER EXTREMITY ROM:     Active  Right eval Left eval  Hip flexion    Hip extension    Hip abduction    Hip adduction    Hip internal rotation    Hip external rotation    Knee flexion    Knee extension    Ankle  dorsiflexion    Ankle plantarflexion    Ankle inversion    Ankle eversion     (Blank rows = not tested)  LOWER EXTREMITY MMT:    MMT Right eval Left eval Right 04/25/23 Left 04/25/23  Hip flexion 4- 3+ 4+ 4+  Hip extension 4- 3+ 4 4  Hip abduction 4- 3+ 4- 4  Hip adduction      Hip internal rotation      Hip external rotation      Knee flexion   5 5  Knee extension   5 5  Ankle dorsiflexion   5 5  Ankle plantarflexion      Ankle inversion      Ankle eversion       (Blank rows = not tested)  LUMBAR SPECIAL TESTS:  SI Compression/distraction test: Positive on the L and Gaenslen's test: Positive  FUNCTIONAL TESTS:  30 seconds chair stand test  GAIT: Distance walked: 110 ft to treatment room  Assistive device utilized: None Level of assistance: Complete Independence Comments: mild antalgic pattern with decreased toe off on the R and and step length on the L  TREATMENT:     [facility] Adult PT Treatment:                                                DATE: 05/23/2023 Manual Therapy: Bil arch taping to support the PF Manual Fascial release of plantar fascia  Therapeutic Activity: Gait training with arch taping, and cues to walk as normal as possible avoding over compensation / supination.  Assessed the navicular drop discussed the benefit of a arch support Self Care: Reviewed techniques and often doing too much can be too much and  pulling back on treat and focusing on a select fews things may be more benefical versus doing all the things to help the situation.    [facility] Adult PT Treatment:                                                DATE: 05/18/23 Therapeutic Exercise: Wall warm up: heel raise , toe tap and foot circles Slant board 1 min  Hip abduction, flexion standing on balance disc  Spring board: single leg hinge with springs Hip hinge to SLS light UE touch  SLS with KB pass 10 lbs  Tandem with KB 10 lbs pass  Hamstring and ITB stretch  Blue band inversion    Manual Therapy: TrP, soft tissue mobilization to posterior tibialis, calf  [facility] Adult PT Treatment:                                                DATE: 05/16/23 Therapeutic Exercise: Recumbent bike L 2 for 5 min  Slant board  Supine hamstring, calf and ITB stretching  Manual Therapy: MTPR along the R glute med, Rt lateral thigh, R soleus/gastroc. And Rt foot/ankle IASTM along the lateral thigh and calf  Soft tissue mobilization to above muscles LAD with adduction     PATIENT EDUCATION: 04/19/2023 Education details: discussed using a frozen water bottle and rolling on the floor on her foot to help with relief on the PF and ice for pain relief.  Person educated: Patient Education method: Explanation, Verbal cues, and Handouts Education comprehension: verbalized understanding  HOME EXERCISE PROGRAM: Access Code: [email or web address] URL: [email or web address] Date: 04/13/2023 Prepared by: Lulu Riding  Exercises - Seated Hamstring Stretch (Mirrored)  - 1 x daily - 7 x weekly - 2 sets - 2 reps - 30 hold - SLR  - 1 x daily - 7 x weekly - 3 sets - 13 reps - 1 hold - Supine Piriformis Stretch  - 1 x daily - 7 x weekly - 2 sets - 2 reps - 30 -60 hold - Sidelying Hip Abduction  - 1 x daily - 7 x weekly - 3 sets - 15 reps - Seated Calf Stretch with Strap  - 1 x daily - 7 x weekly - 2 sets - 10 reps - 30 -60 hold - Toe Yoga - Alternating Great Toe and Lesser Toe Extension  - 1 x daily - 7 x weekly - 2 sets - 10 reps - Towel Scrunches  - 1 x daily - 7 x weekly - 5 sets -inversion  ASSESSMENT:  CLINICAL IMPRESSION: Pt arrived to session noting significant pain in the R foot/ arch and was referencing her MRI results. Further assessment was performed on the arch which she does have a navicular drop bil but does guard on the RLE. Performed STW along the arch and trialed arch support tape which pt noted feeling better with walking and that her pain was gone. Discussed that doing  too much isn't always beneficial and often can make it hard to determine what is helpful vs not beneficial. End of session she denied any pain.    OBJECTIVE IMPAIRMENTS: Abnormal gait, decreased balance, decreased endurance, decreased ROM, decreased strength, improper body mechanics, postural dysfunction, and  pain.   ACTIVITY LIMITATIONS: carrying, lifting, standing, squatting, stairs, and locomotion level  PARTICIPATION LIMITATIONS: shopping, community activity, occupation, yard work, and school  PERSONAL FACTORS: Age, Past/current experiences, Time since onset of injury/illness/exacerbation, and 1 comorbidity: hx of discectomies  are also affecting patient's functional outcome.   REHAB POTENTIAL: Good  CLINICAL DECISION MAKING: Evolving/moderate complexity  EVALUATION COMPLEXITY: Moderate   GOALS: Goals reviewed with patient? Yes  SHORT TERM GOALS: Target date: 04/10/2023  Pt to be IND with initial HEP to assist with therapeutic progression Baseline: Goal status: Met on 04/13/2023  2.  Pt to verbalize/ demo efficient posture and lifting mechanics to prevent and reduce low back pain Baseline:  Goal status: progressing 04/13/2023  3.  Pt to be able to sit , stand and walk for >/= 15 min with </= max of 6/10 pain  Baseline:  Goal status: progressing 04/13/2023  4.  Improve FOTO score to >/48% to demo improving function Baseline:  Goal status: not reassessed on 04/13/2023  LONG TERM GOALS: Target date: 05/08/2023  Pt to be increase gross LE strength to >/=4 to promote functional strength to assist with lifting/ carrying activities as related to her job requirements Baseline:  Goal status: see above, ongoing   2.  Pt to be able to stand and walk for >/=1 hour and perfrom all lifting activities related to her job described as min difficult  with </= 2/10 max pain  Baseline:  Goal status: ongoing   3.  Pt to report no referred RLE symptoms for >/= 1 week for improvement in  function and QOL. Baseline:  Goal status: ongoing   4.  Pt to improve her FOTO score to >/= 59 To demonstrate improvement in function Baseline: 42% Goal status: 51%, DC   5.  Pt to be IND with all HEP and is able to maintain and progress her current LOF IND.  Baseline:  Goal status: ongoing  PLAN:  PT FREQUENCY: 1-2x/week  PT DURATION: 8 weeks  PLANNED INTERVENTIONS: Therapeutic exercises, Therapeutic activity, Neuromuscular re-education, Balance training, Gait training, Patient/Family education, Self Care, Joint mobilization, Joint manipulation, Stair training, Aquatic Therapy, Dry Needling, Spinal manipulation, Spinal mobilization, Cryotherapy, Moist heat, Taping, Traction, Ionotophoresis 4mg /ml Dexamethasone, Manual therapy, and Re-evaluation.  PLAN FOR NEXT SESSION: closed chain balance and ankle/hip/core stability. Manual to RLE .   review / update HEP PRN. Measure LLD, stretch hamstring on L/ strengthening hip flexors on the L, STW for piriformis on the R, gross hip strengthening, stair training. STW on the R calf, calf stretching. Gait training.    Wahneta Derocher PT, DPT, LAT, ATC  05/23/23  12:57 PM

## 2023-05-24 ENCOUNTER — Other Ambulatory Visit (HOSPITAL_COMMUNITY): Payer: Self-pay

## 2023-05-26 ENCOUNTER — Other Ambulatory Visit (HOSPITAL_COMMUNITY): Payer: Self-pay

## 2023-05-26 MED ORDER — ALPRAZOLAM 0.25 MG PO TABS
0.2500 mg | ORAL_TABLET | Freq: Three times a day (TID) | ORAL | 3 refills | Status: AC | PRN
  Filled 2023-05-26: qty 60, 20d supply, fill #0
  Filled 2023-08-07: qty 60, 20d supply, fill #1

## 2023-05-29 ENCOUNTER — Other Ambulatory Visit (HOSPITAL_COMMUNITY): Payer: Self-pay

## 2023-06-05 ENCOUNTER — Other Ambulatory Visit (HOSPITAL_COMMUNITY): Payer: Self-pay

## 2023-06-08 ENCOUNTER — Encounter: Payer: Managed Care, Other (non HMO) | Admitting: Physical Therapy

## 2023-06-14 ENCOUNTER — Encounter: Payer: Managed Care, Other (non HMO) | Admitting: Physical Therapy

## 2023-06-14 ENCOUNTER — Ambulatory Visit: Payer: Managed Care, Other (non HMO) | Admitting: Physical Therapy

## 2023-06-16 ENCOUNTER — Encounter: Payer: Managed Care, Other (non HMO) | Admitting: Physical Therapy

## 2023-06-19 ENCOUNTER — Ambulatory Visit: Payer: Managed Care, Other (non HMO) | Admitting: Physical Therapy

## 2023-06-23 ENCOUNTER — Encounter: Payer: Managed Care, Other (non HMO) | Admitting: Physical Therapy

## 2023-07-04 ENCOUNTER — Other Ambulatory Visit: Payer: Self-pay

## 2023-07-04 ENCOUNTER — Other Ambulatory Visit (HOSPITAL_COMMUNITY): Payer: Self-pay

## 2023-07-19 ENCOUNTER — Other Ambulatory Visit (HOSPITAL_COMMUNITY): Payer: Self-pay

## 2023-08-07 ENCOUNTER — Other Ambulatory Visit: Payer: Self-pay

## 2023-08-07 ENCOUNTER — Other Ambulatory Visit (HOSPITAL_COMMUNITY): Payer: Self-pay

## 2023-09-03 ENCOUNTER — Other Ambulatory Visit (HOSPITAL_COMMUNITY): Payer: Self-pay

## 2023-09-04 ENCOUNTER — Other Ambulatory Visit (HOSPITAL_COMMUNITY): Payer: Self-pay

## 2023-09-04 MED ORDER — ZEPBOUND 12.5 MG/0.5ML ~~LOC~~ SOAJ
12.5000 mg | SUBCUTANEOUS | 6 refills | Status: AC
  Filled 2023-09-04: qty 2, 28d supply, fill #0
  Filled 2023-09-27: qty 2, 28d supply, fill #1
  Filled 2023-11-08: qty 2, 28d supply, fill #2
  Filled 2023-12-26: qty 2, 28d supply, fill #3
  Filled 2024-02-15: qty 2, 28d supply, fill #4
  Filled 2024-04-13: qty 2, 28d supply, fill #5
  Filled 2024-06-19: qty 2, 28d supply, fill #6

## 2023-09-11 ENCOUNTER — Other Ambulatory Visit (HOSPITAL_COMMUNITY): Payer: Self-pay

## 2023-09-12 ENCOUNTER — Other Ambulatory Visit (HOSPITAL_COMMUNITY): Payer: Self-pay

## 2023-09-28 ENCOUNTER — Other Ambulatory Visit (HOSPITAL_COMMUNITY): Payer: Self-pay

## 2023-09-30 ENCOUNTER — Other Ambulatory Visit: Payer: Self-pay

## 2023-10-19 ENCOUNTER — Other Ambulatory Visit (HOSPITAL_COMMUNITY): Payer: Self-pay

## 2023-11-08 ENCOUNTER — Other Ambulatory Visit (HOSPITAL_COMMUNITY): Payer: Self-pay

## 2023-11-08 ENCOUNTER — Encounter (HOSPITAL_COMMUNITY): Payer: Self-pay

## 2023-11-08 MED ORDER — EVAMIST 1.53 MG/SPRAY TD SOLN
TRANSDERMAL | 0 refills | Status: AC
  Filled 2023-11-08: qty 8.1, 56d supply, fill #0
  Filled 2024-05-16 – 2024-05-28 (×2): qty 8.1, 56d supply, fill #1

## 2023-11-08 MED ORDER — ALPRAZOLAM 0.25 MG PO TABS
0.2500 mg | ORAL_TABLET | Freq: Three times a day (TID) | ORAL | 1 refills | Status: AC | PRN
  Filled 2023-11-08: qty 60, 20d supply, fill #0

## 2023-11-08 MED ORDER — PROGESTERONE MICRONIZED 100 MG PO CAPS
100.0000 mg | ORAL_CAPSULE | Freq: Every day | ORAL | 0 refills | Status: AC
  Filled 2023-11-08: qty 90, 90d supply, fill #0

## 2023-11-09 ENCOUNTER — Other Ambulatory Visit (HOSPITAL_COMMUNITY): Payer: Self-pay

## 2023-11-13 ENCOUNTER — Other Ambulatory Visit (HOSPITAL_COMMUNITY): Payer: Self-pay

## 2023-11-20 ENCOUNTER — Other Ambulatory Visit: Payer: Self-pay | Admitting: Internal Medicine

## 2023-11-20 DIAGNOSIS — I7121 Aneurysm of the ascending aorta, without rupture: Secondary | ICD-10-CM

## 2023-11-20 DIAGNOSIS — E041 Nontoxic single thyroid nodule: Secondary | ICD-10-CM

## 2023-11-24 ENCOUNTER — Other Ambulatory Visit: Payer: Self-pay | Admitting: Obstetrics and Gynecology

## 2023-11-24 DIAGNOSIS — Z1231 Encounter for screening mammogram for malignant neoplasm of breast: Secondary | ICD-10-CM

## 2023-11-28 ENCOUNTER — Ambulatory Visit
Admission: RE | Admit: 2023-11-28 | Discharge: 2023-11-28 | Disposition: A | Discharge status: Home / Self Care | Source: Ambulatory Visit | Attending: Internal Medicine | Admitting: Internal Medicine

## 2023-11-28 DIAGNOSIS — I7121 Aneurysm of the ascending aorta, without rupture: Secondary | ICD-10-CM

## 2023-11-28 DIAGNOSIS — E041 Nontoxic single thyroid nodule: Secondary | ICD-10-CM

## 2023-11-28 MED ORDER — IOPAMIDOL (ISOVUE-370) INJECTION 76%
80.0000 mL | Freq: Once | INTRAVENOUS | Status: AC | PRN
  Administered 2023-11-28: 80 mL via INTRAVENOUS

## 2023-12-27 ENCOUNTER — Other Ambulatory Visit (HOSPITAL_COMMUNITY): Payer: Self-pay

## 2023-12-27 MED ORDER — ALPRAZOLAM 0.25 MG PO TABS
0.2500 mg | ORAL_TABLET | Freq: Three times a day (TID) | ORAL | 2 refills | Status: AC | PRN
  Filled 2023-12-27: qty 60, 20d supply, fill #0
  Filled 2024-02-15: qty 60, 20d supply, fill #1
  Filled 2024-04-13: qty 60, 20d supply, fill #2

## 2023-12-27 MED ORDER — PROGESTERONE MICRONIZED 100 MG PO CAPS
100.0000 mg | ORAL_CAPSULE | Freq: Every day | ORAL | 3 refills | Status: AC
  Filled 2023-12-27: qty 90, 90d supply, fill #0
  Filled 2024-02-15 – 2024-04-13 (×2): qty 90, 90d supply, fill #1

## 2023-12-27 MED ORDER — EVAMIST 1.53 MG/SPRAY TD SOLN
1.0000 | TRANSDERMAL | 3 refills | Status: AC
  Filled 2023-12-27: qty 8.1, 56d supply, fill #0
  Filled 2024-02-15: qty 8.1, 56d supply, fill #1
  Filled 2024-04-13: qty 8.1, 56d supply, fill #2

## 2023-12-28 ENCOUNTER — Other Ambulatory Visit (HOSPITAL_COMMUNITY): Payer: Self-pay

## 2024-01-03 ENCOUNTER — Ambulatory Visit
Admission: RE | Admit: 2024-01-03 | Discharge: 2024-01-03 | Disposition: A | Discharge status: Home / Self Care | Source: Ambulatory Visit | Attending: Obstetrics and Gynecology | Admitting: Obstetrics and Gynecology

## 2024-01-03 DIAGNOSIS — Z1231 Encounter for screening mammogram for malignant neoplasm of breast: Secondary | ICD-10-CM

## 2024-02-15 ENCOUNTER — Other Ambulatory Visit: Payer: Self-pay

## 2024-02-15 ENCOUNTER — Other Ambulatory Visit (HOSPITAL_COMMUNITY): Payer: Self-pay

## 2024-02-16 ENCOUNTER — Other Ambulatory Visit: Payer: Self-pay

## 2024-02-16 ENCOUNTER — Other Ambulatory Visit (HOSPITAL_COMMUNITY): Payer: Self-pay

## 2024-03-22 ENCOUNTER — Other Ambulatory Visit (HOSPITAL_COMMUNITY): Payer: Self-pay

## 2024-03-22 MED ORDER — ZOSTER VAC RECOMB ADJUVANTED 50 MCG/0.5ML IM SUSR
0.5000 mL | Freq: Once | INTRAMUSCULAR | 0 refills | Status: AC
  Filled 2024-03-22: qty 0.5, 1d supply, fill #0

## 2024-04-15 ENCOUNTER — Other Ambulatory Visit (HOSPITAL_COMMUNITY): Payer: Self-pay

## 2024-04-15 ENCOUNTER — Other Ambulatory Visit: Payer: Self-pay

## 2024-04-16 ENCOUNTER — Other Ambulatory Visit (HOSPITAL_COMMUNITY): Payer: Self-pay

## 2024-04-16 ENCOUNTER — Other Ambulatory Visit: Payer: Self-pay

## 2024-04-19 ENCOUNTER — Other Ambulatory Visit (HOSPITAL_COMMUNITY): Payer: Self-pay

## 2024-04-19 MED ORDER — FLUZONE HIGH-DOSE 0.5 ML IM SUSY
0.5000 mL | PREFILLED_SYRINGE | Freq: Once | INTRAMUSCULAR | 0 refills | Status: AC
  Filled 2024-04-19: qty 0.5, 1d supply, fill #0

## 2024-04-24 ENCOUNTER — Other Ambulatory Visit (HOSPITAL_COMMUNITY): Payer: Self-pay

## 2024-04-24 MED ORDER — PREDNISONE 10 MG PO TABS
ORAL_TABLET | ORAL | 0 refills | Status: AC
  Filled 2024-04-24: qty 21, 6d supply, fill #0

## 2024-04-24 MED ORDER — CYCLOBENZAPRINE HCL 10 MG PO TABS
10.0000 mg | ORAL_TABLET | Freq: Every evening | ORAL | 0 refills | Status: AC | PRN
  Filled 2024-04-24: qty 14, 14d supply, fill #0

## 2024-05-16 ENCOUNTER — Encounter (HOSPITAL_COMMUNITY): Payer: Self-pay

## 2024-05-16 ENCOUNTER — Other Ambulatory Visit (HOSPITAL_COMMUNITY): Payer: Self-pay

## 2024-05-16 MED ORDER — DOXYCYCLINE HYCLATE 100 MG PO TABS
100.0000 mg | ORAL_TABLET | Freq: Two times a day (BID) | ORAL | 0 refills | Status: AC
  Filled 2024-05-16: qty 10, 5d supply, fill #0

## 2024-05-16 MED ORDER — GABAPENTIN 300 MG PO CAPS
300.0000 mg | ORAL_CAPSULE | Freq: Three times a day (TID) | ORAL | 0 refills | Status: AC
  Filled 2024-05-16: qty 21, 7d supply, fill #0

## 2024-05-16 MED ORDER — CELECOXIB 200 MG PO CAPS
200.0000 mg | ORAL_CAPSULE | Freq: Every day | ORAL | 0 refills | Status: AC
  Filled 2024-05-16: qty 1, 1d supply, fill #0

## 2024-05-16 MED ORDER — ONDANSETRON HCL 4 MG PO TABS
ORAL_TABLET | ORAL | 0 refills | Status: AC
  Filled 2024-05-16: qty 30, 8d supply, fill #0

## 2024-05-21 ENCOUNTER — Other Ambulatory Visit (HOSPITAL_COMMUNITY): Payer: Self-pay

## 2024-05-21 MED ORDER — OXYCODONE HCL 5 MG PO TABS
5.0000 mg | ORAL_TABLET | Freq: Four times a day (QID) | ORAL | 0 refills | Status: AC | PRN
  Filled 2024-05-21: qty 30, 5d supply, fill #0

## 2024-05-28 ENCOUNTER — Other Ambulatory Visit: Payer: Self-pay

## 2024-05-28 ENCOUNTER — Other Ambulatory Visit (HOSPITAL_COMMUNITY): Payer: Self-pay

## 2024-05-29 ENCOUNTER — Other Ambulatory Visit (HOSPITAL_COMMUNITY): Payer: Self-pay

## 2024-05-29 MED ORDER — AMOXICILLIN-POT CLAVULANATE 875-125 MG PO TABS
1.0000 | ORAL_TABLET | Freq: Two times a day (BID) | ORAL | 0 refills | Status: AC
  Filled 2024-05-29: qty 20, 10d supply, fill #0

## 2024-06-10 ENCOUNTER — Other Ambulatory Visit (HOSPITAL_COMMUNITY): Payer: Self-pay

## 2024-06-19 ENCOUNTER — Other Ambulatory Visit (HOSPITAL_COMMUNITY): Payer: Self-pay

## 2024-06-24 ENCOUNTER — Other Ambulatory Visit (HOSPITAL_COMMUNITY): Payer: Self-pay

## 2024-06-24 MED ORDER — GABAPENTIN 300 MG PO CAPS
300.0000 mg | ORAL_CAPSULE | Freq: Three times a day (TID) | ORAL | 0 refills | Status: AC | PRN
  Filled 2024-06-24: qty 21, 7d supply, fill #0
